# Patient Record
Sex: Male | Born: 1955 | Race: White | Hispanic: No | Marital: Married | Smoking: Never smoker
Health system: Southern US, Community
[De-identification: ages and names within clinical notes are randomized; demographics above are authoritative.]

## PROBLEM LIST (undated history)

## (undated) DIAGNOSIS — E785 Hyperlipidemia, unspecified: Secondary | ICD-10-CM

## (undated) DIAGNOSIS — M1990 Unspecified osteoarthritis, unspecified site: Secondary | ICD-10-CM

## (undated) DIAGNOSIS — Z87442 Personal history of urinary calculi: Secondary | ICD-10-CM

## (undated) HISTORY — PX: CYSTOSCOPY: SUR368

## (undated) HISTORY — PX: EXTRACORPOREAL SHOCK WAVE LITHOTRIPSY: SHX1557

## (undated) HISTORY — PX: OTHER SURGICAL HISTORY: SHX169

---

## 2000-11-20 ENCOUNTER — Encounter: Payer: Self-pay | Admitting: Urology

## 2000-11-20 ENCOUNTER — Emergency Department (HOSPITAL_COMMUNITY): Admission: EM | Admit: 2000-11-20 | Discharge: 2000-11-20 | Payer: Self-pay | Admitting: Emergency Medicine

## 2000-11-21 ENCOUNTER — Observation Stay (HOSPITAL_COMMUNITY): Admission: EM | Admit: 2000-11-21 | Discharge: 2000-11-22 | Payer: Self-pay | Admitting: Emergency Medicine

## 2000-11-22 ENCOUNTER — Encounter: Payer: Self-pay | Admitting: Urology

## 2000-11-23 ENCOUNTER — Encounter: Payer: Self-pay | Admitting: Urology

## 2000-11-23 ENCOUNTER — Ambulatory Visit (HOSPITAL_COMMUNITY): Admission: RE | Admit: 2000-11-23 | Discharge: 2000-11-23 | Payer: Self-pay | Admitting: Urology

## 2002-01-14 ENCOUNTER — Encounter: Admission: RE | Admit: 2002-01-14 | Discharge: 2002-01-14 | Payer: Self-pay | Admitting: Internal Medicine

## 2002-01-14 ENCOUNTER — Encounter: Payer: Self-pay | Admitting: Internal Medicine

## 2004-07-12 ENCOUNTER — Ambulatory Visit (HOSPITAL_COMMUNITY): Admission: RE | Admit: 2004-07-12 | Discharge: 2004-07-12 | Payer: Self-pay | Admitting: *Deleted

## 2005-01-31 ENCOUNTER — Encounter: Admission: RE | Admit: 2005-01-31 | Discharge: 2005-01-31 | Payer: Self-pay | Admitting: Internal Medicine

## 2007-10-15 ENCOUNTER — Ambulatory Visit (HOSPITAL_COMMUNITY): Admission: RE | Admit: 2007-10-15 | Discharge: 2007-10-15 | Payer: Self-pay | Admitting: Specialist

## 2008-11-16 ENCOUNTER — Ambulatory Visit (HOSPITAL_BASED_OUTPATIENT_CLINIC_OR_DEPARTMENT_OTHER): Admission: RE | Admit: 2008-11-16 | Discharge: 2008-11-17 | Payer: Self-pay | Admitting: Orthopedic Surgery

## 2010-03-29 ENCOUNTER — Emergency Department (HOSPITAL_COMMUNITY): Admission: EM | Admit: 2010-03-29 | Discharge: 2010-03-29 | Payer: Self-pay | Admitting: Emergency Medicine

## 2011-01-06 LAB — URINALYSIS, ROUTINE W REFLEX MICROSCOPIC
Bilirubin Urine: NEGATIVE
Nitrite: NEGATIVE
Specific Gravity, Urine: 1.025 (ref 1.005–1.030)
Urobilinogen, UA: 0.2 mg/dL (ref 0.0–1.0)
pH: 5 (ref 5.0–8.0)

## 2011-02-03 LAB — POCT HEMOGLOBIN-HEMACUE: Hemoglobin: 15.7 g/dL (ref 13.0–17.0)

## 2011-03-04 NOTE — Op Note (Signed)
Andrew Barnes               ACCOUNT NO.:  [id number]   MEDICAL RECORD NO.:  [id number]          PATIENT TYPE:  AMB   LOCATION:  DSC                          FACILITY:  [facility]   PHYSICIAN:  Andrew Barnes, M.D. DATE OF BIRTH:  [birth year]   DATE OF PROCEDURE:  11/16/2008  DATE OF DISCHARGE:                               OPERATIVE REPORT   PREOPERATIVE DIAGNOSES:  MRI documented retracted rotator cuff tear,  full-thickness mid substance necrotic and severe acromioclavicular  arthropathy.   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  No sign of a  superior labrum anterior and posterior tear and extensive rotator cuff  tendinopathy at the watershed zone between the posterior supraspinatus  and anterior infraspinatus.   OPERATIONS:  1. Examination of left shoulder under anesthesia.  2. Diagnostic arthroscopy, left shoulder with identification of labral      degenerative tear and mid substance necrotic rotator cuff tear      followed by extensive arthroscopic debridement of labrum, grade 4      chondromalacia, abrasion chondroplasty, and rotator cuff      debridement.  3. Arthroscopic subacromial decompression with limited acromioplasty      and capsulectomy of acromioclavicular joint/bursectomy.  4. Arthroscopic distal clavicle resection.  5. Hybrid repair of rotator cuff with arthroscopic placement of      anchors and sutures followed by aborting arthroscopic repair due to      concerns about inadequate purchase of the posterior swivel lock      anchor with fiber tape.   Given the circumstances, I ultimately completed a muscle-splitting  incision, tested the anchor, removed the  anchor, and replaced it with a  6.5-mm Bio-Corkscrew anchor that had excellent purchase.   Ultimately, we achieved a repair with 4 medial mattress sutures and a  repair of over-the-top swivel lock lateral sutures insetting a very low  profile repair.   The coracoacromial ligament was left intact.   OPERATING SURGEON:  Andrew Fitch. Sypher, MD   ASSISTANT:  Andrew Reeks Dasnoit, PA-C   ANESTHESIA:  General by endotracheal technique supplemented by a left  interscalene block.   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   Clinical examination in August 2009 suggested stage III impingement,  weakness, and probable rotator cuff tear.  He was sent for an MRI in  August 2009 that revealed a significant mid substance rotator cuff tear  at the watershed zone between the posterior supraspinatus and anterior  infraspinatus.  He had very unfavorable AC anatomy.   I advised Andrew Barnes that it was not urgent that he proceed with surgery  immediately; however, over time I suspect that his shoulder function and  comfort would deteriorate leading  him to proceed with a repair of the  rotator cuff.  He now returns in January 2010 with increased pain,  increased weakness, desiring to proceed with reconstruction of his  rotator cuff.   After informed consent, he was brought to the operating room at this  time.   Preoperatively, Andrew Barnes interviewed him in the holding area and  recommended proceeding with an interscalene block.  This was placed in  the holding area without complication.   Questions regarding the anticipated surgery invited and answered in  detail.   PROCEDURE:  Andrew Barnes was brought to the operating room and  placed in supine position upon the operating table.   Following induction of general endotracheal anesthesia  under Andrew Barnes  strict supervision, he was carefully positioned in the beach-chair  position with aid of a torso and head holder designed for shoulder  arthroscopy.  The entire left upper extremity and forequarter prepped  with DuraPrep and draped with impervious arthroscopy drapes.   Examination of the shoulder under anesthesia revealed stability.  There  is crepitation in abduction and external rotation beneath the Andrew Barnes joint.   The scope was then placed in a standard posterior viewing portal.  Diagnostic arthroscopy revealed a severely degenerative labrum from 3  o'clock anteriorly to 11 o'clock posteriorly.  An anterior portal was  created under direct vision followed by use of suction shaver to debride  this to a stable margin.   A nerve hook was used to test the biceps origin.  There was no sign of a  significant SLAP lesion.  The deep surface of the rotator cuff was  inspected and cavitation of the articular surface was noted.  This  corresponded with the large defect noted on the MRI.   A lateral portal was created to the deltoid with a guiding spinal needle  followed by use of a trocar and a 4.5-mm suction shaver.  The  degenerative tendon was debrided exposing the greater tuberosity.  The  entire conjoint tendon had pulled off the tuberosity and was covered on  the articular surface with a thin film of synovium and on the bursal  surface with a bursal leader.  After this was debrided, a rather sizable  tear measuring 4 cm in width was encountered.   A suction bur was used to prepare the greater tuberosity with light  decortication to bleeding bone surface and performing a tuberosity-  plasty to lower the profile of the humerus approximately 2-3 mm.   We then proceeded to subacromial debridement.  Extensive bursectomy was  accomplished to facilitate an arthroscopic repair.  The capsule AC joint  was prominent and ulcerated.  It was hanging within the joint.  This was   removed with the suction shaver and cautery followed by resection of the  distal 15 mm clavicle with arthroscopic technique with a bur brought in  anteriorly.  After hemostasis was achieved, we completed our bursal  debridement, preparing to repair the rotator cuff arthroscopically.   Two Bio-Corkscrew anchors were placed through separate portals, one at  the footprint of the supraspinatus, one at the footprint of the  infraspinatus.  Both were driven home.  There were some concerns about  the purchase of the posterior anchor.  We then proceeded to place  arthroscopic sutures with a Scorpion.  This was accomplished without  difficulty until we tensioned the posterior sutures.  I had the sense  that the posterior suture anchor was not secured, therefore, we  elected  to abort the remainder of the purely arthroscopic procedure and  proceeded to a hybrid open repair with the muscle split to deltoid.  Indeed upon inspection, the posterior anchor did not have adequate  purchase.  The anchor was removed while the fiber tape was left in the  infraspinatus.  The posterior hole was re-trephined with a punch and a  6.5-mm Bio-Corkscrew anchors were placed with excellent purchase.  The  four tails of this anchor were placed with a Scorpion suture passer to  create mattress sutures followed by conversion of the fiber tape to a  simple loop with the suture tails exiting superiorly.  The anterior  anchor was used with a mattress suture with the FiberWire and both limbs  of the fiber tape were placed.  A pair of lateral swivel lock inset  anchors were used to inset the tear.  Excellent apposition of the  supraspinatus and infraspinatus to the anatomic footprint was  accomplished.  A very low profile was achieved.  We elected to leave the  coracoacromial ligament intact.   The scope was replaced with glenohumeral joint and the joint was  thoroughly irrigated with sterile saline.  The footprint of repair  was  anatomic.   The wound was irrigated with sterile saline followed by repair of the  deltoid split with an apical corner suture figure-of-eight style of #2  FiberWire and interrupted figure-of-eight sutures of 0 Vicryl.  The skin  was repaired with subcutaneous suture of 2-0 Vicryl and intradermal 2-0  Prolene with Steri-Strips.  There were no apparent complications.  Mr.  Goracke tolerated the surgery and anesthesia well.  He was transferred to  recovery room with stable signs.      Andrew Fitch Barnes, M.D.  Electronically Signed     RVS/MEDQ  D:  11/16/2008  T:  11/17/2008  Job:  [id number]

## 2011-03-07 NOTE — Op Note (Signed)
NAMECEDERICK, Andrew Barnes               ACCOUNT NO.:  [id number]   MEDICAL RECORD NO.:  [id number]          PATIENT TYPE:  AMB   LOCATION:  ENDO                         FACILITY:  [facility]   PHYSICIAN:  Georgiana Spinner, M.D.    DATE OF BIRTH:  10-Oct-1956   DATE OF PROCEDURE:  07/12/2004  DATE OF DISCHARGE:                                 OPERATIVE REPORT   PROCEDURE:  Colonoscopy.   INDICATIONS:  Colon cancer screening.   ANESTHESIA:  1.  Demerol 60 mg.  2.  Versed 6 mg.   DESCRIPTION OF PROCEDURE:  With the patient mildly sedated in the left  lateral decubitus position, a rectal examination was performed which was  unremarkable.  The prostate appeared normal.  Subsequently, the Olympus  videoscopic colonoscope was inserted in the rectum and passed under direct  vision to the cecum, identified by ileocecal valve and appendiceal orifice,  both of which were photographed.  From this point, the colonoscope was  slowly withdrawn, taking circumferential views of the colonic mucosa,  stopping only in the rectum which appeared normal on direct and retroflexed  view.  The endoscope was straightened and withdrawn.  The patient's vital  signs and pulse oximeter remained stable.  The patient tolerated the  procedure well without apparent complications.   FINDINGS:  Essentially normal examination.   PLAN:  Consider repeat examination in 5-10 years.      GMO/MEDQ  D:  07/12/2004  T:  07/12/2004  Job:  [id number]

## 2015-12-03 DIAGNOSIS — K4090 Unilateral inguinal hernia, without obstruction or gangrene, not specified as recurrent: Secondary | ICD-10-CM | POA: Diagnosis not present

## 2016-04-21 DIAGNOSIS — E038 Other specified hypothyroidism: Secondary | ICD-10-CM | POA: Diagnosis not present

## 2016-04-21 DIAGNOSIS — Z125 Encounter for screening for malignant neoplasm of prostate: Secondary | ICD-10-CM | POA: Diagnosis not present

## 2016-04-28 DIAGNOSIS — Z0000 Encounter for general adult medical examination without abnormal findings: Secondary | ICD-10-CM | POA: Diagnosis not present

## 2016-04-28 DIAGNOSIS — E038 Other specified hypothyroidism: Secondary | ICD-10-CM | POA: Diagnosis not present

## 2016-04-28 DIAGNOSIS — Z1212 Encounter for screening for malignant neoplasm of rectum: Secondary | ICD-10-CM | POA: Diagnosis not present

## 2016-04-28 DIAGNOSIS — K4090 Unilateral inguinal hernia, without obstruction or gangrene, not specified as recurrent: Secondary | ICD-10-CM | POA: Diagnosis not present

## 2016-04-28 DIAGNOSIS — Z125 Encounter for screening for malignant neoplasm of prostate: Secondary | ICD-10-CM | POA: Diagnosis not present

## 2016-04-28 DIAGNOSIS — Z1389 Encounter for screening for other disorder: Secondary | ICD-10-CM | POA: Diagnosis not present

## 2016-04-28 DIAGNOSIS — I4510 Unspecified right bundle-branch block: Secondary | ICD-10-CM | POA: Diagnosis not present

## 2016-04-28 DIAGNOSIS — E784 Other hyperlipidemia: Secondary | ICD-10-CM | POA: Diagnosis not present

## 2016-04-28 DIAGNOSIS — Z6828 Body mass index (BMI) 28.0-28.9, adult: Secondary | ICD-10-CM | POA: Diagnosis not present

## 2016-04-28 DIAGNOSIS — G4709 Other insomnia: Secondary | ICD-10-CM | POA: Diagnosis not present

## 2016-04-28 DIAGNOSIS — F330 Major depressive disorder, recurrent, mild: Secondary | ICD-10-CM | POA: Diagnosis not present

## 2016-06-16 DIAGNOSIS — M25562 Pain in left knee: Secondary | ICD-10-CM | POA: Diagnosis not present

## 2016-06-16 DIAGNOSIS — M25561 Pain in right knee: Secondary | ICD-10-CM | POA: Diagnosis not present

## 2016-06-16 DIAGNOSIS — M1711 Unilateral primary osteoarthritis, right knee: Secondary | ICD-10-CM | POA: Diagnosis not present

## 2016-06-16 DIAGNOSIS — M1712 Unilateral primary osteoarthritis, left knee: Secondary | ICD-10-CM | POA: Diagnosis not present

## 2016-06-16 DIAGNOSIS — M170 Bilateral primary osteoarthritis of knee: Secondary | ICD-10-CM | POA: Diagnosis not present

## 2016-06-17 DIAGNOSIS — L821 Other seborrheic keratosis: Secondary | ICD-10-CM | POA: Diagnosis not present

## 2016-06-17 DIAGNOSIS — L570 Actinic keratosis: Secondary | ICD-10-CM | POA: Diagnosis not present

## 2016-07-01 DIAGNOSIS — H900 Conductive hearing loss, bilateral: Secondary | ICD-10-CM | POA: Diagnosis not present

## 2016-07-01 DIAGNOSIS — H6983 Other specified disorders of Eustachian tube, bilateral: Secondary | ICD-10-CM | POA: Diagnosis not present

## 2016-07-01 DIAGNOSIS — Z882 Allergy status to sulfonamides status: Secondary | ICD-10-CM | POA: Diagnosis not present

## 2016-07-01 DIAGNOSIS — Z881 Allergy status to other antibiotic agents status: Secondary | ICD-10-CM | POA: Diagnosis not present

## 2016-08-30 DIAGNOSIS — Z23 Encounter for immunization: Secondary | ICD-10-CM | POA: Diagnosis not present

## 2016-10-29 DIAGNOSIS — Z1389 Encounter for screening for other disorder: Secondary | ICD-10-CM | POA: Diagnosis not present

## 2016-10-29 DIAGNOSIS — Z6829 Body mass index (BMI) 29.0-29.9, adult: Secondary | ICD-10-CM | POA: Diagnosis not present

## 2016-10-29 DIAGNOSIS — F330 Major depressive disorder, recurrent, mild: Secondary | ICD-10-CM | POA: Diagnosis not present

## 2016-10-29 DIAGNOSIS — J09X2 Influenza due to identified novel influenza A virus with other respiratory manifestations: Secondary | ICD-10-CM | POA: Diagnosis not present

## 2016-10-29 DIAGNOSIS — G4700 Insomnia, unspecified: Secondary | ICD-10-CM | POA: Diagnosis not present

## 2016-10-29 DIAGNOSIS — R21 Rash and other nonspecific skin eruption: Secondary | ICD-10-CM | POA: Diagnosis not present

## 2016-10-29 DIAGNOSIS — R509 Fever, unspecified: Secondary | ICD-10-CM | POA: Diagnosis not present

## 2016-12-12 DIAGNOSIS — Z87442 Personal history of urinary calculi: Secondary | ICD-10-CM | POA: Diagnosis not present

## 2016-12-12 DIAGNOSIS — N5201 Erectile dysfunction due to arterial insufficiency: Secondary | ICD-10-CM | POA: Diagnosis not present

## 2016-12-12 DIAGNOSIS — N486 Induration penis plastica: Secondary | ICD-10-CM | POA: Diagnosis not present

## 2016-12-31 DIAGNOSIS — F330 Major depressive disorder, recurrent, mild: Secondary | ICD-10-CM | POA: Diagnosis not present

## 2016-12-31 DIAGNOSIS — Z6829 Body mass index (BMI) 29.0-29.9, adult: Secondary | ICD-10-CM | POA: Diagnosis not present

## 2017-05-26 DIAGNOSIS — E038 Other specified hypothyroidism: Secondary | ICD-10-CM | POA: Diagnosis not present

## 2017-05-26 DIAGNOSIS — E784 Other hyperlipidemia: Secondary | ICD-10-CM | POA: Diagnosis not present

## 2017-05-26 DIAGNOSIS — Z125 Encounter for screening for malignant neoplasm of prostate: Secondary | ICD-10-CM | POA: Diagnosis not present

## 2017-05-28 DIAGNOSIS — E784 Other hyperlipidemia: Secondary | ICD-10-CM | POA: Diagnosis not present

## 2017-05-28 DIAGNOSIS — M179 Osteoarthritis of knee, unspecified: Secondary | ICD-10-CM | POA: Diagnosis not present

## 2017-05-28 DIAGNOSIS — Z0000 Encounter for general adult medical examination without abnormal findings: Secondary | ICD-10-CM | POA: Diagnosis not present

## 2017-05-28 DIAGNOSIS — G4709 Other insomnia: Secondary | ICD-10-CM | POA: Diagnosis not present

## 2017-06-01 DIAGNOSIS — Z1212 Encounter for screening for malignant neoplasm of rectum: Secondary | ICD-10-CM | POA: Diagnosis not present

## 2017-06-18 DIAGNOSIS — L720 Epidermal cyst: Secondary | ICD-10-CM | POA: Diagnosis not present

## 2017-06-18 DIAGNOSIS — L570 Actinic keratosis: Secondary | ICD-10-CM | POA: Diagnosis not present

## 2017-06-18 DIAGNOSIS — L821 Other seborrheic keratosis: Secondary | ICD-10-CM | POA: Diagnosis not present

## 2017-07-30 DIAGNOSIS — G8929 Other chronic pain: Secondary | ICD-10-CM | POA: Diagnosis not present

## 2017-07-30 DIAGNOSIS — M25561 Pain in right knee: Secondary | ICD-10-CM | POA: Diagnosis not present

## 2017-07-30 DIAGNOSIS — M25562 Pain in left knee: Secondary | ICD-10-CM | POA: Diagnosis not present

## 2017-07-30 DIAGNOSIS — M170 Bilateral primary osteoarthritis of knee: Secondary | ICD-10-CM | POA: Diagnosis not present

## 2017-08-05 DIAGNOSIS — Z23 Encounter for immunization: Secondary | ICD-10-CM | POA: Diagnosis not present

## 2018-01-05 DIAGNOSIS — M25511 Pain in right shoulder: Secondary | ICD-10-CM | POA: Diagnosis not present

## 2018-01-05 DIAGNOSIS — M25512 Pain in left shoulder: Secondary | ICD-10-CM | POA: Diagnosis not present

## 2018-01-14 DIAGNOSIS — M170 Bilateral primary osteoarthritis of knee: Secondary | ICD-10-CM | POA: Diagnosis not present

## 2018-02-03 DIAGNOSIS — M25511 Pain in right shoulder: Secondary | ICD-10-CM | POA: Diagnosis not present

## 2018-02-10 DIAGNOSIS — M25511 Pain in right shoulder: Secondary | ICD-10-CM | POA: Diagnosis not present

## 2018-03-01 DIAGNOSIS — M62121 Other rupture of muscle (nontraumatic), right upper arm: Secondary | ICD-10-CM | POA: Diagnosis not present

## 2018-03-01 DIAGNOSIS — M66821 Spontaneous rupture of other tendons, right upper arm: Secondary | ICD-10-CM | POA: Diagnosis not present

## 2018-03-01 DIAGNOSIS — M75121 Complete rotator cuff tear or rupture of right shoulder, not specified as traumatic: Secondary | ICD-10-CM | POA: Diagnosis not present

## 2018-04-16 DIAGNOSIS — M170 Bilateral primary osteoarthritis of knee: Secondary | ICD-10-CM | POA: Diagnosis not present

## 2018-05-31 DIAGNOSIS — Z125 Encounter for screening for malignant neoplasm of prostate: Secondary | ICD-10-CM | POA: Diagnosis not present

## 2018-05-31 DIAGNOSIS — E038 Other specified hypothyroidism: Secondary | ICD-10-CM | POA: Diagnosis not present

## 2018-05-31 DIAGNOSIS — E7849 Other hyperlipidemia: Secondary | ICD-10-CM | POA: Diagnosis not present

## 2018-05-31 DIAGNOSIS — R82998 Other abnormal findings in urine: Secondary | ICD-10-CM | POA: Diagnosis not present

## 2018-06-07 DIAGNOSIS — M179 Osteoarthritis of knee, unspecified: Secondary | ICD-10-CM | POA: Diagnosis not present

## 2018-06-07 DIAGNOSIS — Z0000 Encounter for general adult medical examination without abnormal findings: Secondary | ICD-10-CM | POA: Diagnosis not present

## 2018-06-07 DIAGNOSIS — Z01818 Encounter for other preprocedural examination: Secondary | ICD-10-CM | POA: Diagnosis not present

## 2018-06-07 DIAGNOSIS — E7849 Other hyperlipidemia: Secondary | ICD-10-CM | POA: Diagnosis not present

## 2018-06-11 DIAGNOSIS — Z1212 Encounter for screening for malignant neoplasm of rectum: Secondary | ICD-10-CM | POA: Diagnosis not present

## 2018-06-22 DIAGNOSIS — L821 Other seborrheic keratosis: Secondary | ICD-10-CM | POA: Diagnosis not present

## 2018-06-22 DIAGNOSIS — L570 Actinic keratosis: Secondary | ICD-10-CM | POA: Diagnosis not present

## 2018-06-22 DIAGNOSIS — L820 Inflamed seborrheic keratosis: Secondary | ICD-10-CM | POA: Diagnosis not present

## 2018-06-22 DIAGNOSIS — D485 Neoplasm of uncertain behavior of skin: Secondary | ICD-10-CM | POA: Diagnosis not present

## 2018-07-02 DIAGNOSIS — M545 Low back pain: Secondary | ICD-10-CM | POA: Diagnosis not present

## 2018-07-27 ENCOUNTER — Encounter: Payer: Self-pay | Admitting: Student

## 2018-07-27 NOTE — H&P (Signed)
TOTAL KNEE ADMISSION H&P  Patient is being admitted for left total knee arthroplasty.  Subjective:  Chief Complaint:left knee pain.  HPI: Andrew Barnes, 62 y.o. male, has a history of pain and functional disability in the left knee due to arthritis and has failed non-surgical conservative treatments for greater than 12 weeks to includecorticosteriod injections, viscosupplementation injections and activity modification.  Onset of symptoms was gradual, starting >10 years ago with gradually worsening course since that time. The patient noted prior procedures on the knee to include  arthroscopy on the left knee(s).  Patient currently rates pain in the left knee(s) at 10 out of 10 with activity. Patient has worsening of pain with activity and weight bearing, crepitus, joint swelling and instability.  Patient has evidence of lateral and patellofemoral bone-on-bone arthritis in the left knee by imaging studies. There is no active infection.  There are no active problems to display for this patient.  History reviewed. No pertinent past medical history.  History reviewed. No pertinent surgical history.  No current facility-administered medications for this encounter.    No current outpatient medications on file.   Allergies not on file  Social History   Tobacco Use  . Smoking status: Not on file  Substance Use Topics  . Alcohol use: Not on file    History reviewed. No pertinent family history.   Review of Systems  Constitutional: Negative for chills and fever.  HENT: Negative for congestion, sore throat and tinnitus.   Eyes: Negative for double vision, photophobia and pain.  Respiratory: Negative for cough, shortness of breath and wheezing.   Cardiovascular: Negative for chest pain, palpitations and orthopnea.  Gastrointestinal: Negative for heartburn, nausea and vomiting.  Genitourinary: Negative for dysuria, frequency and urgency.  Musculoskeletal: Positive for joint pain.    Neurological: Negative for dizziness, weakness and headaches.    Objective:  Physical Exam  Well nourished and well developed. General: Alert and oriented x3, cooperative and pleasant, no acute distress. Head: normocephalic, atraumatic, neck supple. Eyes: EOMI. Respiratory: breath sounds clear in all fields, no wheezing, rales, or rhonchi. Cardiovascular: Regular rate and rhythm, no murmurs, gallops or rubs.  Abdomen: non-tender to palpation and soft, normoactive bowel sounds. Musculoskeletal: Right Knee Exam: No effusion. Range of motion is 0-135 degrees. No crepitus on range of motion of the knee. Mild medial joint line tenderness. No lateral joint line tenderness. Stable knee. Left Knee Exam: No effusion. Old scars medially. Range of motion is 0-125 degrees. Moderate crepitus on range of motion of the knee. Normal patellar tracking.Some medial greater than lateral joint line tenderness. Stable knee. Calves soft and nontender. Motor function intact in LE. Strength 5/5 LE bilaterally. Neuro: Distal pulses 2+. Sensation to light touch intact in LE.  Vital signs in last 24 hours: Blood pressure: 140/86 mmHg Pulse: 60 bpm   Labs:   There is no height or weight on file to calculate BMI.   Imaging Review Plain radiographs demonstrate severe degenerative joint disease of the left knee(s). The overall alignment isneutral. The bone quality appears to be adequate for age and reported activity level.   Preoperative templating of the joint replacement has been completed, documented, and submitted to the Operating Room personnel in order to optimize intra-operative equipment management.   Anticipated LOS equal to or greater than 2 midnights due to - Age 25 and older with one or more of the following:  - Obesity  - Expected need for hospital services (PT, OT, Nursing) required for safe  discharge  - Anticipated need for postoperative skilled nursing care or inpatient rehab  - Active  co-morbidities: None OR   - Unanticipated findings during/Post Surgery: None  - Patient is a high risk of re-admission due to: None     Assessment/Plan:  End stage arthritis, left knee   The patient history, physical examination, clinical judgment of the provider and imaging studies are consistent with end stage degenerative joint disease of the left knee(s) and total knee arthroplasty is deemed medically necessary. The treatment options including medical management, injection therapy arthroscopy and arthroplasty were discussed at length. The risks and benefits of total knee arthroplasty were presented and reviewed. The risks due to aseptic loosening, infection, stiffness, patella tracking problems, thromboembolic complications and other imponderables were discussed. The patient acknowledged the explanation, agreed to proceed with the plan and consent was signed. Patient is being admitted for inpatient treatment for surgery, pain control, PT, OT, prophylactic antibiotics, VTE prophylaxis, progressive ambulation and ADL's and discharge planning. The patient is planning to be discharged home with outpatient physical therapy.   Therapy Plans: outpatient therapy at [facility] Disposition: Home with wife Planned DVT Prophylaxis: Aspirin 325 mg BID DME needed: None PCP: Dr. Link Snuffer TXA: IV Allergies: Erythromycin, sulfa (hives) Anesthesia Concerns: None  - Patient was instructed on what medications to stop prior to surgery. - Follow-up visit in 2 weeks with Dr. Lequita Halt - Begin physical therapy following surgery - Pre-operative lab work as pre-surgical testing - Prescriptions will be provided in hospital at time of discharge  Arther Abbott, PA-C Orthopedic Surgery [facility] [place]

## 2018-08-04 ENCOUNTER — Other Ambulatory Visit (HOSPITAL_COMMUNITY): Payer: Self-pay | Admitting: *Deleted

## 2018-08-04 NOTE — Patient Instructions (Addendum)
Andrew Barnes  08/04/2018   Your procedure is scheduled on: 08-16-18  Report to [facility] Main  Entrance  Report to admitting at 800 AM    Call this number if you have problems the morning of surgery [phone]   Remember: Do not eat food or drink liquids :After Midnight. BRUSH YOUR TEETH MORNING OF SURGERY AND RINSE YOUR MOUTH OUT, NO CHEWING GUM CANDY OR MINTS.     Take these medicines the morning of surgery with A SIP OF WATER: LEVOTHYROXINE                                You may not have any metal on your body including hair pins and              piercings  Do not wear jewelry, make-up, lotions, powders or perfumes, deodorant             Do not wear nail polish.  Do not shave  48 hours prior to surgery.              Men may shave face and neck.   Do not bring valuables to the hospital. [facility] IS NOT             RESPONSIBLE   FOR VALUABLES.  Contacts, dentures or bridgework may not be worn into surgery.  Leave suitcase in the car. After surgery it may be brought to your room.                 Please read over the following fact sheets you were given: _____________________________________________________________________             [facility] - Preparing for Surgery Before surgery, you can play an important role.  Because skin is not sterile, your skin needs to be as free of germs as possible.  You can reduce the number of germs on your skin by washing with CHG (chlorahexidine gluconate) soap before surgery.  CHG is an antiseptic cleaner which kills germs and bonds with the skin to continue killing germs even after washing. Please DO NOT use if you have an allergy to CHG or antibacterial soaps.  If your skin becomes reddened/irritated stop using the CHG and inform your nurse when you arrive at Short Stay. Do not shave (including legs and underarms) for at least 48 hours prior to the first CHG shower.  You may shave your face/neck. Please  follow these instructions carefully:  1.  Shower with CHG Soap the night before surgery and the  morning of Surgery.  2.  If you choose to wash your hair, wash your hair first as usual with your  normal  shampoo.  3.  After you shampoo, rinse your hair and body thoroughly to remove the  shampoo.                           4.  Use CHG as you would any other liquid soap.  You can apply chg directly  to the skin and wash                       Gently with a scrungie or clean washcloth.  5.  Apply the CHG Soap to your body ONLY FROM THE NECK DOWN.  Do not use on face/ open                           Wound or open sores. Avoid contact with eyes, ears mouth and genitals (private parts).                       Wash face,  Genitals (private parts) with your normal soap.             6.  Wash thoroughly, paying special attention to the area where your surgery  will be performed.  7.  Thoroughly rinse your body with warm water from the neck down.  8.  DO NOT shower/wash with your normal soap after using and rinsing off  the CHG Soap.                9.  Pat yourself dry with a clean towel.            10.  Wear clean pajamas.            11.  Place clean sheets on your bed the night of your first shower and do not  sleep with pets. Day of Surgery : Do not apply any lotions/deodorants the morning of surgery.  Please wear clean clothes to the hospital/surgery center.  FAILURE TO FOLLOW THESE INSTRUCTIONS MAY RESULT IN THE CANCELLATION OF YOUR SURGERY PATIENT SIGNATURE_________________________________  NURSE SIGNATURE__________________________________  ________________________________________________________________________   Andrew Barnes  An incentive spirometer is a tool that can help keep your lungs clear and active. This tool measures how well you are filling your lungs with each breath. Taking long deep breaths may help reverse or decrease the chance of developing breathing (pulmonary) problems  (especially infection) following:  A long period of time when you are unable to move or be active. BEFORE THE PROCEDURE   If the spirometer includes an indicator to show your best effort, your nurse or respiratory therapist will set it to a desired goal.  If possible, sit up straight or lean slightly forward. Try not to slouch.  Hold the incentive spirometer in an upright position. INSTRUCTIONS FOR USE  1. Sit on the edge of your bed if possible, or sit up as far as you can in bed or on a chair. 2. Hold the incentive spirometer in an upright position. 3. Breathe out normally. 4. Place the mouthpiece in your mouth and seal your lips tightly around it. 5. Breathe in slowly and as deeply as possible, raising the piston or the ball toward the top of the column. 6. Hold your breath for 3-5 seconds or for as long as possible. Allow the piston or ball to fall to the bottom of the column. 7. Remove the mouthpiece from your mouth and breathe out normally. 8. Rest for a few seconds and repeat Steps 1 through 7 at least 10 times every 1-2 hours when you are awake. Take your time and take a few normal breaths between deep breaths. 9. The spirometer may include an indicator to show your best effort. Use the indicator as a goal to work toward during each repetition. 10. After each set of 10 deep breaths, practice coughing to be sure your lungs are clear. If you have an incision (the cut made at the time of surgery), support your incision when coughing by placing a pillow or rolled up towels firmly against it. Once you are able to get out of  bed, walk around indoors and cough well. You may stop using the incentive spirometer when instructed by your caregiver.  RISKS AND COMPLICATIONS  Take your time so you do not get dizzy or light-headed.  If you are in pain, you may need to take or ask for pain medication before doing incentive spirometry. It is harder to take a deep breath if you are having  pain. AFTER USE  Rest and breathe slowly and easily.  It can be helpful to keep track of a log of your progress. Your caregiver can provide you with a simple table to help with this. If you are using the spirometer at home, follow these instructions: [place] IF:   You are having difficultly using the spirometer.  You have trouble using the spirometer as often as instructed.  Your pain medication is not giving enough relief while using the spirometer.  You develop fever of 100.5 F (38.1 C) or higher. SEEK IMMEDIATE MEDICAL CARE IF:   You cough up bloody sputum that had not been present before.  You develop fever of 102 F (38.9 C) or greater.  You develop worsening pain at or near the incision site. MAKE SURE YOU:   Understand these instructions.  Will watch your condition.  Will get help right away if you are not doing well or get worse. Document Released: 02/16/2007 Document Revised: 12/29/2011 Document Reviewed: 04/19/2007 Exit[employer] Patient Information 2014 Exit[employer], [place].   ________________________________________________________________________  WHAT IS A BLOOD TRANSFUSION? Blood Transfusion Information  A transfusion is the replacement of blood or some of its parts. Blood is made up of multiple cells which provide different functions.  Red blood cells carry oxygen and are used for blood loss replacement.  White blood cells fight against infection.  Platelets control bleeding.  Plasma helps clot blood.  Other blood products are available for specialized needs, such as hemophilia or other clotting disorders. BEFORE THE TRANSFUSION  Who gives blood for transfusions?   Healthy volunteers who are fully evaluated to make sure their blood is safe. This is blood bank blood. Transfusion therapy is the safest it has ever been in the practice of medicine. Before blood is taken from a donor, a complete history is taken to make sure that person has no history  of diseases nor engages in risky social behavior (examples are intravenous drug use or sexual activity with multiple partners). The donor's travel history is screened to minimize risk of transmitting infections, such as malaria. The donated blood is tested for signs of infectious diseases, such as HIV and hepatitis. The blood is then tested to be sure it is compatible with you in order to minimize the chance of a transfusion reaction. If you or a relative donates blood, this is often done in anticipation of surgery and is not appropriate for emergency situations. It takes many days to process the donated blood. RISKS AND COMPLICATIONS Although transfusion therapy is very safe and saves many lives, the main dangers of transfusion include:   Getting an infectious disease.  Developing a transfusion reaction. This is an allergic reaction to something in the blood you were given. Every precaution is taken to prevent this. The decision to have a blood transfusion has been considered carefully by your caregiver before blood is given. Blood is not given unless the benefits outweigh the risks. AFTER THE TRANSFUSION  Right after receiving a blood transfusion, you will usually feel much better and more energetic. This is especially true if your red blood  cells have gotten low (anemic). The transfusion raises the level of the red blood cells which carry oxygen, and this usually causes an energy increase.  The nurse administering the transfusion will monitor you carefully for complications. HOME CARE INSTRUCTIONS  No special instructions are needed after a transfusion. You may find your energy is better. Speak with your caregiver about any limitations on activity for underlying diseases you may have. SEEK MEDICAL CARE IF:   Your condition is not improving after your transfusion.  You develop redness or irritation at the intravenous (IV) site. SEEK IMMEDIATE MEDICAL CARE IF:  Any of the following symptoms  occur over the next 12 hours:  Shaking chills.  You have a temperature by mouth above 102 F (38.9 C), not controlled by medicine.  Chest, back, or muscle pain.  People around you feel you are not acting correctly or are confused.  Shortness of breath or difficulty breathing.  Dizziness and fainting.  You get a rash or develop hives.  You have a decrease in urine output.  Your urine turns a dark color or changes to pink, red, or brown. Any of the following symptoms occur over the next 10 days:  You have a temperature by mouth above 102 F (38.9 C), not controlled by medicine.  Shortness of breath.  Weakness after normal activity.  The white part of the eye turns yellow (jaundice).  You have a decrease in the amount of urine or are urinating less often.  Your urine turns a dark color or changes to pink, red, or brown. Document Released: 10/03/2000 Document Revised: 12/29/2011 Document Reviewed: 05/22/2008 [facility] Patient Information 2014 [place], [place].  _______________________________________________________________________

## 2018-08-07 DIAGNOSIS — Z23 Encounter for immunization: Secondary | ICD-10-CM | POA: Diagnosis not present

## 2018-08-11 ENCOUNTER — Encounter (HOSPITAL_COMMUNITY)
Admission: RE | Admit: 2018-08-11 | Discharge: 2018-08-11 | Disposition: A | Discharge status: Home / Self Care | Payer: Medicare Other | Source: Ambulatory Visit | Attending: Orthopedic Surgery | Admitting: Orthopedic Surgery

## 2018-08-11 ENCOUNTER — Other Ambulatory Visit: Payer: Self-pay

## 2018-08-11 ENCOUNTER — Encounter (HOSPITAL_COMMUNITY): Payer: Self-pay

## 2018-08-11 DIAGNOSIS — M1712 Unilateral primary osteoarthritis, left knee: Secondary | ICD-10-CM | POA: Diagnosis not present

## 2018-08-11 DIAGNOSIS — Z01812 Encounter for preprocedural laboratory examination: Secondary | ICD-10-CM | POA: Insufficient documentation

## 2018-08-11 HISTORY — DX: Hyperlipidemia, unspecified: E78.5

## 2018-08-11 HISTORY — DX: Personal history of urinary calculi: Z87.442

## 2018-08-11 HISTORY — DX: Unspecified osteoarthritis, unspecified site: M19.90

## 2018-08-11 LAB — CBC
HEMATOCRIT: 44 % (ref 39.0–52.0)
Hemoglobin: 14.5 g/dL (ref 13.0–17.0)
MCH: 31.3 pg (ref 26.0–34.0)
MCHC: 33 g/dL (ref 30.0–36.0)
MCV: 94.8 fL (ref 80.0–100.0)
NRBC: 0 % (ref 0.0–0.2)
PLATELETS: 244 10*3/uL (ref 150–400)
RBC: 4.64 MIL/uL (ref 4.22–5.81)
RDW: 12.2 % (ref 11.5–15.5)
WBC: 4.3 10*3/uL (ref 4.0–10.5)

## 2018-08-11 LAB — APTT: aPTT: 31 seconds (ref 24–36)

## 2018-08-11 LAB — PROTIME-INR
INR: 0.88
PROTHROMBIN TIME: 11.8 s (ref 11.4–15.2)

## 2018-08-11 LAB — COMPREHENSIVE METABOLIC PANEL
ALT: 34 U/L (ref 0–44)
AST: 24 U/L (ref 15–41)
Albumin: 4.4 g/dL (ref 3.5–5.0)
Alkaline Phosphatase: 58 U/L (ref 38–126)
Anion gap: 9 (ref 5–15)
BILIRUBIN TOTAL: 1 mg/dL (ref 0.3–1.2)
BUN: 17 mg/dL (ref 8–23)
CHLORIDE: 104 mmol/L (ref 98–111)
CO2: 26 mmol/L (ref 22–32)
Calcium: 10 mg/dL (ref 8.9–10.3)
Creatinine, Ser: 1.04 mg/dL (ref 0.61–1.24)
GFR calc Af Amer: >60 mL/min (ref >60)
Glucose, Bld: 104 mg/dL — ABNORMAL HIGH (ref 70–99)
POTASSIUM: 4.5 mmol/L (ref 3.5–5.1)
Sodium: 139 mmol/L (ref 135–145)
Total Protein: 7.5 g/dL (ref 6.5–8.1)

## 2018-08-11 LAB — SURGICAL PCR SCREEN
MRSA, PCR: NEGATIVE
Staphylococcus aureus: NEGATIVE

## 2018-08-11 LAB — ABO/RH: ABO/RH(D): O POS

## 2018-08-11 NOTE — Progress Notes (Addendum)
Medical clearance and ekg done 819-19 on chart

## 2018-08-15 MED ORDER — BUPIVACAINE LIPOSOME 1.3 % IJ SUSP
20.0000 mL | Freq: Once | INTRAMUSCULAR | Status: DC
  Filled 2018-08-15: qty 20

## 2018-08-16 ENCOUNTER — Encounter (HOSPITAL_COMMUNITY): Payer: Self-pay | Admitting: *Deleted

## 2018-08-16 ENCOUNTER — Inpatient Hospital Stay (HOSPITAL_COMMUNITY): Payer: Medicare Other | Admitting: Anesthesiology

## 2018-08-16 ENCOUNTER — Other Ambulatory Visit: Payer: Self-pay

## 2018-08-16 ENCOUNTER — Inpatient Hospital Stay (HOSPITAL_COMMUNITY)
Admission: RE | Admit: 2018-08-16 | Discharge: 2018-08-17 | DRG: 470 | Disposition: A | Discharge status: Home / Self Care | Payer: Medicare Other | Attending: Orthopedic Surgery | Admitting: Orthopedic Surgery

## 2018-08-16 ENCOUNTER — Encounter (HOSPITAL_COMMUNITY)
Admission: RE | Disposition: A | Discharge status: Home / Self Care | Payer: Self-pay | Source: Home / Self Care | Attending: Orthopedic Surgery

## 2018-08-16 DIAGNOSIS — M1710 Unilateral primary osteoarthritis, unspecified knee: Secondary | ICD-10-CM | POA: Diagnosis present

## 2018-08-16 DIAGNOSIS — Z881 Allergy status to other antibiotic agents status: Secondary | ICD-10-CM

## 2018-08-16 DIAGNOSIS — Z6828 Body mass index (BMI) 28.0-28.9, adult: Secondary | ICD-10-CM

## 2018-08-16 DIAGNOSIS — E785 Hyperlipidemia, unspecified: Secondary | ICD-10-CM | POA: Diagnosis not present

## 2018-08-16 DIAGNOSIS — M25762 Osteophyte, left knee: Secondary | ICD-10-CM | POA: Diagnosis not present

## 2018-08-16 DIAGNOSIS — E669 Obesity, unspecified: Secondary | ICD-10-CM | POA: Diagnosis present

## 2018-08-16 DIAGNOSIS — Z87442 Personal history of urinary calculi: Secondary | ICD-10-CM

## 2018-08-16 DIAGNOSIS — M179 Osteoarthritis of knee, unspecified: Secondary | ICD-10-CM | POA: Diagnosis present

## 2018-08-16 DIAGNOSIS — Z882 Allergy status to sulfonamides status: Secondary | ICD-10-CM | POA: Diagnosis not present

## 2018-08-16 DIAGNOSIS — M1712 Unilateral primary osteoarthritis, left knee: Secondary | ICD-10-CM | POA: Diagnosis not present

## 2018-08-16 DIAGNOSIS — G8918 Other acute postprocedural pain: Secondary | ICD-10-CM | POA: Diagnosis not present

## 2018-08-16 HISTORY — PX: TOTAL KNEE ARTHROPLASTY: SHX125

## 2018-08-16 LAB — TYPE AND SCREEN
ABO/RH(D): O POS
Antibody Screen: NEGATIVE

## 2018-08-16 SURGERY — ARTHROPLASTY, KNEE, TOTAL
Anesthesia: Spinal | Site: Knee | Laterality: Left

## 2018-08-16 MED ORDER — SODIUM CHLORIDE 0.9 % IV SOLN
INTRAVENOUS | Status: DC
  Administered 2018-08-17: 05:00:00 via INTRAVENOUS

## 2018-08-16 MED ORDER — METOCLOPRAMIDE HCL 5 MG PO TABS: 5.0000 mg | ORAL_TABLET | Freq: Three times a day (TID) | ORAL | Status: DC | PRN

## 2018-08-16 MED ORDER — TRAMADOL HCL 50 MG PO TABS
50.0000 mg | ORAL_TABLET | Freq: Four times a day (QID) | ORAL | Status: DC | PRN
  Administered 2018-08-17: 50 mg via ORAL
  Administered 2018-08-17: 100 mg via ORAL
  Filled 2018-08-16: qty 1
  Filled 2018-08-16: qty 2

## 2018-08-16 MED ORDER — BUPIVACAINE LIPOSOME 1.3 % IJ SUSP
INTRAMUSCULAR | Status: DC | PRN
  Administered 2018-08-16: 20 mL

## 2018-08-16 MED ORDER — PROPOFOL 10 MG/ML IV BOLUS
INTRAVENOUS | Status: AC
  Filled 2018-08-16: qty 40

## 2018-08-16 MED ORDER — ASPIRIN EC 325 MG PO TBEC
325.0000 mg | DELAYED_RELEASE_TABLET | Freq: Two times a day (BID) | ORAL | Status: DC
  Administered 2018-08-17: 325 mg via ORAL
  Filled 2018-08-16: qty 1

## 2018-08-16 MED ORDER — HYDROMORPHONE HCL 1 MG/ML IJ SOLN: 0.2500 mg | INTRAMUSCULAR | Status: DC | PRN

## 2018-08-16 MED ORDER — MIDAZOLAM HCL 2 MG/2ML IJ SOLN
2.0000 mg | Freq: Once | INTRAMUSCULAR | Status: AC
  Administered 2018-08-16 (×2): 1 mg via INTRAVENOUS
  Filled 2018-08-16: qty 2

## 2018-08-16 MED ORDER — PROMETHAZINE HCL 25 MG/ML IJ SOLN: 6.2500 mg | INTRAMUSCULAR | Status: DC | PRN

## 2018-08-16 MED ORDER — CEFAZOLIN SODIUM-DEXTROSE 2-4 GM/100ML-% IV SOLN
2.0000 g | INTRAVENOUS | Status: AC
  Administered 2018-08-16: 2 g via INTRAVENOUS
  Filled 2018-08-16: qty 100

## 2018-08-16 MED ORDER — ONDANSETRON HCL 4 MG/2ML IJ SOLN
INTRAMUSCULAR | Status: AC
  Filled 2018-08-16: qty 2

## 2018-08-16 MED ORDER — DEXAMETHASONE SODIUM PHOSPHATE 10 MG/ML IJ SOLN: 8.0000 mg | Freq: Once | INTRAMUSCULAR | Status: DC

## 2018-08-16 MED ORDER — LEVOTHYROXINE SODIUM 50 MCG PO TABS
50.0000 ug | ORAL_TABLET | Freq: Every day | ORAL | Status: DC
  Administered 2018-08-17: 50 ug via ORAL
  Filled 2018-08-16: qty 1

## 2018-08-16 MED ORDER — SODIUM CHLORIDE 0.9 % IR SOLN
Status: DC | PRN
  Administered 2018-08-16: 1000 mL

## 2018-08-16 MED ORDER — SODIUM CHLORIDE 0.9 % IJ SOLN
INTRAMUSCULAR | Status: AC
  Filled 2018-08-16: qty 50

## 2018-08-16 MED ORDER — EPHEDRINE 5 MG/ML INJ
INTRAVENOUS | Status: AC
  Filled 2018-08-16: qty 10

## 2018-08-16 MED ORDER — CLONIDINE HCL (ANALGESIA) 100 MCG/ML EP SOLN
EPIDURAL | Status: DC | PRN
  Administered 2018-08-16: 50 ug

## 2018-08-16 MED ORDER — ONDANSETRON HCL 4 MG/2ML IJ SOLN
INTRAMUSCULAR | Status: DC | PRN
  Administered 2018-08-16: 4 mg via INTRAVENOUS

## 2018-08-16 MED ORDER — CEFAZOLIN SODIUM-DEXTROSE 2-4 GM/100ML-% IV SOLN
2.0000 g | Freq: Four times a day (QID) | INTRAVENOUS | Status: AC
  Administered 2018-08-16 (×2): 2 g via INTRAVENOUS
  Filled 2018-08-16 (×2): qty 100

## 2018-08-16 MED ORDER — FLEET ENEMA 7-19 GM/118ML RE ENEM: 1.0000 | ENEMA | Freq: Once | RECTAL | Status: DC | PRN

## 2018-08-16 MED ORDER — DEXAMETHASONE SODIUM PHOSPHATE 10 MG/ML IJ SOLN
10.0000 mg | Freq: Once | INTRAMUSCULAR | Status: AC
  Administered 2018-08-17: 10 mg via INTRAVENOUS
  Filled 2018-08-16: qty 1

## 2018-08-16 MED ORDER — GABAPENTIN 300 MG PO CAPS
300.0000 mg | ORAL_CAPSULE | Freq: Once | ORAL | Status: AC
  Administered 2018-08-16: 300 mg via ORAL
  Filled 2018-08-16: qty 1

## 2018-08-16 MED ORDER — MENTHOL 3 MG MT LOZG: 1.0000 | LOZENGE | OROMUCOSAL | Status: DC | PRN

## 2018-08-16 MED ORDER — ONDANSETRON HCL 4 MG PO TABS: 4.0000 mg | ORAL_TABLET | Freq: Four times a day (QID) | ORAL | Status: DC | PRN

## 2018-08-16 MED ORDER — OXYCODONE HCL 5 MG PO TABS
5.0000 mg | ORAL_TABLET | ORAL | Status: DC | PRN
  Administered 2018-08-16 (×2): 5 mg via ORAL
  Administered 2018-08-16: 10 mg via ORAL
  Administered 2018-08-17: 5 mg via ORAL
  Administered 2018-08-17: 10 mg via ORAL
  Filled 2018-08-16: qty 1
  Filled 2018-08-16 (×3): qty 2
  Filled 2018-08-16: qty 1

## 2018-08-16 MED ORDER — METOCLOPRAMIDE HCL 5 MG/ML IJ SOLN: 5.0000 mg | Freq: Three times a day (TID) | INTRAMUSCULAR | Status: DC | PRN

## 2018-08-16 MED ORDER — DOCUSATE SODIUM 100 MG PO CAPS
100.0000 mg | ORAL_CAPSULE | Freq: Two times a day (BID) | ORAL | Status: DC
  Administered 2018-08-16 – 2018-08-17 (×2): 100 mg via ORAL
  Filled 2018-08-16 (×2): qty 1

## 2018-08-16 MED ORDER — FENTANYL CITRATE (PF) 100 MCG/2ML IJ SOLN
100.0000 ug | Freq: Once | INTRAMUSCULAR | Status: AC
  Administered 2018-08-16 (×2): 50 ug via INTRAVENOUS
  Filled 2018-08-16: qty 2

## 2018-08-16 MED ORDER — METHOCARBAMOL 500 MG IVPB - SIMPLE MED
500.0000 mg | Freq: Four times a day (QID) | INTRAVENOUS | Status: DC | PRN
  Administered 2018-08-16: 500 mg via INTRAVENOUS
  Filled 2018-08-16: qty 50
  Filled 2018-08-16: qty 500

## 2018-08-16 MED ORDER — BUPIVACAINE HCL (PF) 0.5 % IJ SOLN
INTRAMUSCULAR | Status: DC | PRN
  Administered 2018-08-16: 25 mL

## 2018-08-16 MED ORDER — TRANEXAMIC ACID-NACL 1000-0.7 MG/100ML-% IV SOLN
1000.0000 mg | INTRAVENOUS | Status: AC
  Administered 2018-08-16: 1000 mg via INTRAVENOUS
  Filled 2018-08-16: qty 100

## 2018-08-16 MED ORDER — TRANEXAMIC ACID-NACL 1000-0.7 MG/100ML-% IV SOLN
1000.0000 mg | Freq: Once | INTRAVENOUS | Status: AC
  Administered 2018-08-16: 1000 mg via INTRAVENOUS
  Filled 2018-08-16: qty 100

## 2018-08-16 MED ORDER — CHLORHEXIDINE GLUCONATE 4 % EX LIQD: 60.0000 mL | Freq: Once | CUTANEOUS | Status: DC

## 2018-08-16 MED ORDER — PHENYLEPHRINE 40 MCG/ML (10ML) SYRINGE FOR IV PUSH (FOR BLOOD PRESSURE SUPPORT)
PREFILLED_SYRINGE | INTRAVENOUS | Status: DC | PRN
  Administered 2018-08-16 (×2): 80 ug via INTRAVENOUS

## 2018-08-16 MED ORDER — MEPERIDINE HCL 50 MG/ML IJ SOLN: 6.2500 mg | INTRAMUSCULAR | Status: DC | PRN

## 2018-08-16 MED ORDER — ONDANSETRON HCL 4 MG/2ML IJ SOLN: 4.0000 mg | Freq: Four times a day (QID) | INTRAMUSCULAR | Status: DC | PRN

## 2018-08-16 MED ORDER — OXYCODONE HCL 5 MG PO TABS: 10.0000 mg | ORAL_TABLET | ORAL | Status: DC | PRN

## 2018-08-16 MED ORDER — SODIUM CHLORIDE 0.9 % IJ SOLN
INTRAMUSCULAR | Status: DC | PRN
  Administered 2018-08-16: 60 mL

## 2018-08-16 MED ORDER — DIPHENHYDRAMINE HCL 12.5 MG/5ML PO ELIX
12.5000 mg | ORAL_SOLUTION | ORAL | Status: DC | PRN
  Administered 2018-08-16: 25 mg via ORAL
  Filled 2018-08-16: qty 10

## 2018-08-16 MED ORDER — PHENYLEPHRINE 40 MCG/ML (10ML) SYRINGE FOR IV PUSH (FOR BLOOD PRESSURE SUPPORT)
PREFILLED_SYRINGE | INTRAVENOUS | Status: AC
  Filled 2018-08-16: qty 10

## 2018-08-16 MED ORDER — ACETAMINOPHEN 10 MG/ML IV SOLN
1000.0000 mg | Freq: Four times a day (QID) | INTRAVENOUS | Status: DC
  Administered 2018-08-16: 1000 mg via INTRAVENOUS
  Filled 2018-08-16: qty 100

## 2018-08-16 MED ORDER — ACETAMINOPHEN 500 MG PO TABS
1000.0000 mg | ORAL_TABLET | Freq: Four times a day (QID) | ORAL | Status: AC
  Administered 2018-08-16 – 2018-08-17 (×4): 1000 mg via ORAL
  Filled 2018-08-16 (×4): qty 2

## 2018-08-16 MED ORDER — METHOCARBAMOL 500 MG PO TABS
500.0000 mg | ORAL_TABLET | Freq: Four times a day (QID) | ORAL | Status: DC | PRN
  Administered 2018-08-16 – 2018-08-17 (×3): 500 mg via ORAL
  Filled 2018-08-16 (×3): qty 1

## 2018-08-16 MED ORDER — POLYETHYLENE GLYCOL 3350 17 G PO PACK: 17.0000 g | PACK | Freq: Every day | ORAL | Status: DC | PRN

## 2018-08-16 MED ORDER — SODIUM CHLORIDE 0.9 % IJ SOLN
INTRAMUSCULAR | Status: AC
  Filled 2018-08-16: qty 10

## 2018-08-16 MED ORDER — DEXAMETHASONE SODIUM PHOSPHATE 10 MG/ML IJ SOLN
INTRAMUSCULAR | Status: AC
  Filled 2018-08-16: qty 1

## 2018-08-16 MED ORDER — EPHEDRINE SULFATE-NACL 50-0.9 MG/10ML-% IV SOSY
PREFILLED_SYRINGE | INTRAVENOUS | Status: DC | PRN
  Administered 2018-08-16 (×2): 10 mg via INTRAVENOUS
  Administered 2018-08-16: 15 mg via INTRAVENOUS

## 2018-08-16 MED ORDER — GABAPENTIN 300 MG PO CAPS
300.0000 mg | ORAL_CAPSULE | Freq: Three times a day (TID) | ORAL | Status: DC
  Administered 2018-08-16 – 2018-08-17 (×3): 300 mg via ORAL
  Filled 2018-08-16 (×3): qty 1

## 2018-08-16 MED ORDER — PROPOFOL 500 MG/50ML IV EMUL
INTRAVENOUS | Status: DC | PRN
  Administered 2018-08-16: 100 ug/kg/min via INTRAVENOUS

## 2018-08-16 MED ORDER — BUPIVACAINE IN DEXTROSE 0.75-8.25 % IT SOLN
INTRATHECAL | Status: DC | PRN
  Administered 2018-08-16: 1.8 mL via INTRATHECAL

## 2018-08-16 MED ORDER — PRAVASTATIN SODIUM 20 MG PO TABS
40.0000 mg | ORAL_TABLET | Freq: Every day | ORAL | Status: DC
  Administered 2018-08-16: 40 mg via ORAL
  Filled 2018-08-16: qty 2

## 2018-08-16 MED ORDER — BISACODYL 10 MG RE SUPP: 10.0000 mg | Freq: Every day | RECTAL | Status: DC | PRN

## 2018-08-16 MED ORDER — LACTATED RINGERS IV SOLN
INTRAVENOUS | Status: DC
  Administered 2018-08-16: 09:00:00 via INTRAVENOUS

## 2018-08-16 MED ORDER — PHENOL 1.4 % MT LIQD: 1.0000 | OROMUCOSAL | Status: DC | PRN

## 2018-08-16 MED ORDER — DEXAMETHASONE SODIUM PHOSPHATE 10 MG/ML IJ SOLN
INTRAMUSCULAR | Status: DC | PRN
  Administered 2018-08-16: 10 mg via INTRAVENOUS

## 2018-08-16 SURGICAL SUPPLY — 57 items
ATTUNE MED DOME PAT 38 KNEE (Knees) ×1 IMPLANT
ATTUNE PS FEM LT SZ 6 CEM KNEE (Femur) ×1 IMPLANT
ATTUNE PSRP INSR SZ6 12 KNEE (Insert) ×1 IMPLANT
BAG SPEC THK2 15X12 ZIP CLS (MISCELLANEOUS) ×1
BAG ZIPLOCK 12X15 (MISCELLANEOUS) ×2 IMPLANT
BANDAGE ACE 6X5 VEL STRL LF (GAUZE/BANDAGES/DRESSINGS) ×2 IMPLANT
BASE TIBIA ATTUNE KNEE SYS SZ6 (Knees) IMPLANT
BLADE SAG 18X100X1.27 (BLADE) ×2 IMPLANT
BLADE SAW SGTL 11.0X1.19X90.0M (BLADE) ×2 IMPLANT
BOWL SMART MIX CTS (DISPOSABLE) ×2 IMPLANT
BSPLAT TIB 6 CMNT ROT PLAT STR (Knees) ×1 IMPLANT
CEMENT HV SMART SET (Cement) ×4 IMPLANT
CLSR STERI-STRIP ANTIMIC 1/2X4 (GAUZE/BANDAGES/DRESSINGS) ×1 IMPLANT
COVER SURGICAL LIGHT HANDLE (MISCELLANEOUS) ×2 IMPLANT
COVER WAND RF STERILE (DRAPES) IMPLANT
CUFF TOURN SGL QUICK 34 (TOURNIQUET CUFF) ×2
CUFF TRNQT CYL 34X4X40X1 (TOURNIQUET CUFF) ×1 IMPLANT
DECANTER SPIKE VIAL GLASS SM (MISCELLANEOUS) ×2 IMPLANT
DRAPE U-SHAPE 47X51 STRL (DRAPES) ×2 IMPLANT
DRSG ADAPTIC 3X8 NADH LF (GAUZE/BANDAGES/DRESSINGS) ×2 IMPLANT
DRSG EMULSION OIL 3X16 NADH (GAUZE/BANDAGES/DRESSINGS) ×1 IMPLANT
DRSG PAD ABDOMINAL 8X10 ST (GAUZE/BANDAGES/DRESSINGS) ×2 IMPLANT
DURAPREP 26ML APPLICATOR (WOUND CARE) ×3 IMPLANT
ELECT REM PT RETURN 15FT ADLT (MISCELLANEOUS) ×2 IMPLANT
EVACUATOR 1/8 PVC DRAIN (DRAIN) ×2 IMPLANT
GAUZE SPONGE 4X4 12PLY STRL (GAUZE/BANDAGES/DRESSINGS) ×2 IMPLANT
GLOVE BIO SURGEON STRL SZ8 (GLOVE) ×2 IMPLANT
GLOVE BIOGEL PI IND STRL 6.5 (GLOVE) ×1 IMPLANT
GLOVE BIOGEL PI IND STRL 8 (GLOVE) ×1 IMPLANT
GLOVE BIOGEL PI INDICATOR 6.5 (GLOVE) ×1
GLOVE BIOGEL PI INDICATOR 8 (GLOVE) ×1
GLOVE SURG SS PI 6.5 STRL IVOR (GLOVE) ×2 IMPLANT
GOWN STRL REUS W/TWL LRG LVL3 (GOWN DISPOSABLE) ×5 IMPLANT
GOWN STRL REUS W/TWL XL LVL3 (GOWN DISPOSABLE) ×2 IMPLANT
HANDPIECE INTERPULSE COAX TIP (DISPOSABLE) ×2
HOLDER FOLEY CATH W/STRAP (MISCELLANEOUS) IMPLANT
IMMOBILIZER KNEE 20 (SOFTGOODS) ×3 IMPLANT
IMMOBILIZER KNEE 20 THIGH 36 (SOFTGOODS) ×1 IMPLANT
MANIFOLD NEPTUNE II (INSTRUMENTS) ×2 IMPLANT
NS IRRIG 1000ML POUR BTL (IV SOLUTION) ×2 IMPLANT
PACK TOTAL KNEE CUSTOM (KITS) ×2 IMPLANT
PAD ABD 8X10 STRL (GAUZE/BANDAGES/DRESSINGS) ×1 IMPLANT
PADDING CAST COTTON 6X4 STRL (CAST SUPPLIES) ×4 IMPLANT
PIN STEINMAN FIXATION KNEE (PIN) ×1 IMPLANT
POSITIONER SURGICAL ARM (MISCELLANEOUS) ×2 IMPLANT
SET HNDPC FAN SPRY TIP SCT (DISPOSABLE) ×1 IMPLANT
STRIP CLOSURE SKIN 1/2X4 (GAUZE/BANDAGES/DRESSINGS) ×4 IMPLANT
SUT MNCRL AB 4-0 PS2 18 (SUTURE) ×2 IMPLANT
SUT STRATAFIX 0 PDS 27 VIOLET (SUTURE) ×2
SUT VIC AB 2-0 CT1 27 (SUTURE) ×6
SUT VIC AB 2-0 CT1 TAPERPNT 27 (SUTURE) ×3 IMPLANT
SUTURE STRATFX 0 PDS 27 VIOLET (SUTURE) ×1 IMPLANT
TIBIA ATTUNE KNEE SYS BASE SZ6 (Knees) ×2 IMPLANT
TRAY FOLEY MTR SLVR 16FR STAT (SET/KITS/TRAYS/PACK) ×2 IMPLANT
WATER STERILE IRR 1000ML POUR (IV SOLUTION) ×4 IMPLANT
WRAP KNEE MAXI GEL POST OP (GAUZE/BANDAGES/DRESSINGS) ×2 IMPLANT
YANKAUER SUCT BULB TIP 10FT TU (MISCELLANEOUS) ×2 IMPLANT

## 2018-08-16 NOTE — Op Note (Signed)
OPERATIVE REPORT-TOTAL KNEE ARTHROPLASTY   Pre-operative diagnosis- Osteoarthritis  Left knee(s)  Post-operative diagnosis- Osteoarthritis Left knee(s)  Procedure-  Left  Total Knee Arthroplasty (Depuy Attune)  Surgeon- Gus Rankin. Derrika Ruffalo, MD  Assistant- Dimitri Ped, PA-C   Anesthesia-  Adductor canal block and spinal  EBL-20 mL   Drains Hemovac  Tourniquet time-  Total Tourniquet Time Documented: Thigh (Left) - 45 minutes Total: Thigh (Left) - 45 minutes     Complications- None  Condition-PACU - hemodynamically stable.   Brief Clinical Note   Andrew Barnes is a 62 y.o. year old male with end stage OA of his left knee with progressively worsening pain and dysfunction. He has constant pain, with activity and at rest and significant functional deficits with difficulties even with ADLs. He has had extensive non-op management including analgesics, injections of cortisone and viscosupplements, and home exercise program, but remains in significant pain with significant dysfunction. Radiographs show bone on bone arthritis medial and patellofemoral. He presents now for left Total Knee Arthroplasty.    Procedure in detail---   The patient is brought into the operating room and positioned supine on the operating table. After successful administration of  Adductor canal block and spinal,   a tourniquet is placed high on the  Left thigh(s) and the lower extremity is prepped and draped in the usual sterile fashion. Time out is performed by the operating team and then the  Left lower extremity is wrapped in Esmarch, knee flexed and the tourniquet inflated to 300 mmHg.       A midline incision is made with a ten blade through the subcutaneous tissue to the level of the extensor mechanism. A fresh blade is used to make a medial parapatellar arthrotomy. Soft tissue over the proximal medial tibia is subperiosteally elevated to the joint line with a knife and into the semimembranosus bursa  with a Cobb elevator. Soft tissue over the proximal lateral tibia is elevated with attention being paid to avoiding the patellar tendon on the tibial tubercle. The patella is everted, knee flexed 90 degrees and the ACL and PCL are removed. Findings are bone on bone medial and patellofemoral        The drill is used to create a starting hole in the distal femur and the canal is thoroughly irrigated with sterile saline to remove the fatty contents. The 5 degree Left  valgus alignment guide is placed into the femoral canal and the distal femoral cutting block is pinned to remove 9 mm off the distal femur. Resection is made with an oscillating saw.      The tibia is subluxed forward and the menisci are removed. The extramedullary alignment guide is placed referencing proximally at the medial aspect of the tibial tubercle and distally along the second metatarsal axis and tibial crest. The block is pinned to remove 2mm off the more deficient medial  side. Resection is made with an oscillating saw. Size 6is the most appropriate size for the tibia and the proximal tibia is prepared with the modular drill and keel punch for that size. I had to remove a screw from the proximal tibia prior to doing the keel punch      The femoral sizing guide is placed and size 6 is most appropriate. Rotation is marked off the epicondylar axis and confirmed by creating a rectangular flexion gap at 90 degrees. The size 6 cutting block is pinned in this rotation and the anterior, posterior and chamfer cuts are made with  the oscillating saw. The intercondylar block is then placed and that cut is made.      Trial size 6 tibial component, trial size 6 posterior stabilized femur and a 12  mm posterior stabilized rotating platform insert trial is placed. Full extension is achieved with excellent varus/valgus and anterior/posterior balance throughout full range of motion. The patella is everted and thickness measured to be 24  mm. Free hand  resection is taken to 14 mm, a 38 template is placed, lug holes are drilled, trial patella is placed, and it tracks normally. Osteophytes are removed off the posterior femur with the trial in place. All trials are removed and the cut bone surfaces prepared with pulsatile lavage. Cement is mixed and once ready for implantation, the size 6 tibial implant, size  6 posterior stabilized femoral component, and the size 38 patella are cemented in place and the patella is held with the clamp. The trial insert is placed and the knee held in full extension. The Exparel (20 ml mixed with 60 ml saline) is injected into the extensor mechanism, posterior capsule, medial and lateral gutters and subcutaneous tissues.  All extruded cement is removed and once the cement is hard the permanent 12 mm posterior stabilized rotating platform insert is placed into the tibial tray.      The wound is copiously irrigated with saline solution and the extensor mechanism closed over a hemovac drain with #1 V-loc suture. The tourniquet is released for a total tourniquet time of 45  minutes. Flexion against gravity is 140 degrees and the patella tracks normally. Subcutaneous tissue is closed with 2.0 vicryl and subcuticular with running 4.0 Monocryl. The incision is cleaned and dried and steri-strips and a bulky sterile dressing are applied. The limb is placed into a knee immobilizer and the patient is awakened and transported to recovery in stable condition.      Please note that a surgical assistant was a medical necessity for this procedure in order to perform it in a safe and expeditious manner. Surgical assistant was necessary to retract the ligaments and vital neurovascular structures to prevent injury to them and also necessary for proper positioning of the limb to allow for anatomic placement of the prosthesis.   Gus Rankin Latarra Eagleton, MD    08/16/2018, 11:55 AM

## 2018-08-16 NOTE — Anesthesia Preprocedure Evaluation (Signed)
Anesthesia Evaluation  Patient identified by MRN, date of birth, ID band Patient awake    Reviewed: Allergy & Precautions, NPO status , Patient's Chart, lab work & pertinent test results  Airway Mallampati: II  TM Distance: >3 FB Neck ROM: Full    Dental no notable dental hx.    Pulmonary neg pulmonary ROS,    Pulmonary exam normal breath sounds clear to auscultation       Cardiovascular negative cardio ROS Normal cardiovascular exam Rhythm:Regular Rate:Normal     Neuro/Psych negative neurological ROS  negative psych ROS   GI/Hepatic negative GI ROS, Neg liver ROS,   Endo/Other  negative endocrine ROS  Renal/GU negative Renal ROS     Musculoskeletal  (+) Arthritis ,   Abdominal   Peds  Hematology negative hematology ROS (+)   Anesthesia Other Findings   Reproductive/Obstetrics                             Anesthesia Physical Anesthesia Plan  ASA: II  Anesthesia Plan: Spinal   Post-op Pain Management:  [facility] for Post-op pain   Induction: Intravenous  PONV Risk Score and Plan: 2 and Ondansetron, Dexamethasone, Propofol infusion and Midazolam  Airway Management Planned:   Additional Equipment: None  Intra-op Plan:   Post-operative Plan:   Informed Consent: I have reviewed the patients History and Physical, chart, labs and discussed the procedure including the risks, benefits and alternatives for the proposed anesthesia with the patient or authorized representative who has indicated his/her understanding and acceptance.   Dental advisory given  Plan Discussed with: CRNA  Anesthesia Plan Comments:         Anesthesia Quick Evaluation

## 2018-08-16 NOTE — Interval H&P Note (Signed)
History and Physical Interval Note:  08/16/2018 8:32 AM  Andrew Barnes  has presented today for surgery, with the diagnosis of Left knee osteoarthritis  The various methods of treatment have been discussed with the patient and family. After consideration of risks, benefits and other options for treatment, the patient has consented to  Procedure(s): LEFT TOTAL KNEE ARTHROPLASTY (Left) as a surgical intervention .  The patient's history has been reviewed, patient examined, no change in status, stable for surgery.  I have reviewed the patient's chart and labs.  Questions were answered to the patient's satisfaction.     [name]

## 2018-08-16 NOTE — Progress Notes (Signed)
AssistedDr. Germeroth with left, ultrasound guided, adductor canal block. Side rails up, monitors on throughout procedure. See vital signs in flow sheet. Tolerated Procedure well.  

## 2018-08-16 NOTE — Evaluation (Signed)
Physical Therapy Evaluation Patient Details Name: Andrew Barnes MRN: [id number] DOB: 03-17-1956 Today's Date: 08/16/2018   History of Present Illness  62 Y.o. male s/p L TKR on 08/06/18. PMH includes HLD, OA, L RTC surgery, multiple L knee surgeries.   Clinical Impression  Pt presents with moderate L knee pain, difficulty performing mobility tasks, decreased L knee ROM, and decreased endurance for ambulation. Pt to benefit from acute PT to address deficits. Pt ambulated 50 ft with RW with min guard assist, requiring mod verbal cuing for sequencing. PT to progress mobility as tolerated, and will continue to follow acutely.      Follow Up Recommendations Follow surgeon's recommendation for DC plan and follow-up therapies;Supervision for mobility/OOB(OPPT)    Equipment Recommendations  None recommended by PT    Recommendations for Other Services       Precautions / Restrictions Precautions Precautions: Fall Required Braces or Orthoses: Knee Immobilizer - Left Knee Immobilizer - Left: On when out of bed or walking;Discontinue once straight leg raise with < 10 degree lag Restrictions Weight Bearing Restrictions: No Other Position/Activity Restrictions: WBAT       Mobility  Bed Mobility Overal bed mobility: Needs Assistance Bed Mobility: Supine to Sit     Supine to sit: Min assist;HOB elevated     General bed mobility comments: Min assist for LLE management. Verbal cuing for getting to EOB.   Transfers Overall transfer level: Needs assistance Equipment used: Rolling walker (2 wheeled) Transfers: Sit to/from Stand Sit to Stand: Min guard;From elevated surface         General transfer comment: Min guard for safety. Verbal cuing for hand placement.   Ambulation/Gait Ambulation/Gait assistance: Min guard Gait Distance (Feet): 50 Feet Assistive device: Rolling walker (2 wheeled) Gait Pattern/deviations: Step-to pattern;Decreased stride length;Decreased weight shift to  left;Decreased stance time - left Gait velocity: decr    General Gait Details: Min guard for safety. Verbal cuing for placement in RW  Stairs            Wheelchair Mobility    Modified Rankin (Stroke Patients Only)       Balance Overall balance assessment: Mild deficits observed, not formally tested                                           Pertinent Vitals/Pain Pain Assessment: 0-10 Pain Score: 4  Pain Location: L knee Pain Descriptors / Indicators: Aching;Sore Pain Intervention(s): Limited activity within patient's tolerance;Repositioned;Ice applied;Monitored during session    Home Living Family/patient expects to be discharged to:: Private residence Living Arrangements: Spouse/significant other Available Help at Discharge: Family;Available PRN/intermittently Type of Home: House Home Access: Stairs to enter Entrance Stairs-Rails: Right Entrance Stairs-Number of Steps: 3 Home Layout: One level Home Equipment: Shower seat;Other (comment);Walker - 2 wheels;Cane - single point;Crutches;Bedside commode Additional Comments: uses milk carton to prop RLE on when bathing or using the bathroom.     Prior Function Level of Independence: Independent               Hand Dominance   Dominant Hand: Right    Extremity/Trunk Assessment   Upper Extremity Assessment Upper Extremity Assessment: Overall WFL for tasks assessed    Lower Extremity Assessment Lower Extremity Assessment: Overall WFL for tasks assessed;RLE deficits/detail;LLE deficits/detail RLE Deficits / Details: approximately 10 degrees knee recurvatum with quad set  LLE Deficits / Details: suspected post-surgical weakness;  able to perform quad set, ankle pumps, SLR with min assist with <10* quad lag  LLE Sensation: decreased light touch(decreased sensation around gluteal area; pt with no difficulty performing hip extension in standing or ambulating)    Cervical / Trunk  Assessment Cervical / Trunk Assessment: Normal  Communication   Communication: No difficulties  Cognition Arousal/Alertness: Awake/alert Behavior During Therapy: WFL for tasks assessed/performed Overall Cognitive Status: Within Functional Limits for tasks assessed                                        General Comments      Exercises Total Joint Exercises Ankle Circles/Pumps: AROM;Both;10 reps;Seated Goniometric ROM: Knee AAROM approximately 0-60*, limited by pain and stiffness. One cavitation noted in initial knee flexion, painless and pt is unsure of where the "pop" came from    Assessment/Plan    PT Assessment Patient needs continued PT services  PT Problem List Decreased strength;Pain;Decreased range of motion;Decreased activity tolerance;Decreased knowledge of use of DME;Decreased balance;Decreased safety awareness;Decreased mobility       PT Treatment Interventions Therapeutic activities;DME instruction;Gait training;Therapeutic exercise;Patient/family education;Stair training;Balance training;Functional mobility training    PT Goals (Current goals can be found in the Care Plan section)  Acute Rehab PT Goals Patient Stated Goal: decrease pain  PT Goal Formulation: With patient Time For Goal Achievement: 08/23/18 Potential to Achieve Goals: Good    Frequency 7X/week   Barriers to discharge        Co-evaluation               AM-PAC PT "6 Clicks" Daily Activity  Outcome Measure Difficulty turning over in bed (including adjusting bedclothes, sheets and blankets)?: Unable Difficulty moving from lying on back to sitting on the side of the bed? : Unable Difficulty sitting down on and standing up from a chair with arms (e.g., wheelchair, bedside commode, etc,.)?: Unable Help needed moving to and from a bed to chair (including a wheelchair)?: None Help needed walking in hospital room?: A Little Help needed climbing 3-5 steps with a railing? : A  Little 6 Click Score: 13    End of Session Equipment Utilized During Treatment: Gait belt Activity Tolerance: Patient tolerated treatment well Patient left: in chair;with chair alarm set;with call bell/phone within reach;with family/visitor present;with SCD's reapplied Nurse Communication: Mobility status PT Visit Diagnosis: Other abnormalities of gait and mobility (R26.89);Difficulty in walking, not elsewhere classified (R26.2)    Time: 2956-2130 PT Time Calculation (min) (ACUTE ONLY): 30 min   Charges:   PT Evaluation $PT Eval Low Complexity: 1 Low PT Treatments $Gait Training: 8-22 mins        Nicola Police, PT Acute Rehabilitation Services Pager [phone]  Office [phone]   Aanvi Voyles D Lachlan Mckim 08/16/2018, 7:04 PM

## 2018-08-16 NOTE — Transfer of Care (Signed)
Immediate Anesthesia Transfer of Care Note  Patient: Andrew Barnes  Procedure(s) Performed: LEFT TOTAL KNEE ARTHROPLASTY (Left Knee)  Patient Location: PACU  Anesthesia Type:Spinal and MAC combined with regional for post-op pain  Level of Consciousness: awake, alert , oriented and patient cooperative  Airway & Oxygen Therapy: Patient Spontanous Breathing and Patient connected to face mask oxygen  Post-op Assessment: Report given to RN and Post -op Vital signs reviewed and stable  Post vital signs: Reviewed and stable  Last Vitals:  Vitals Value Taken Time  BP    Temp    Pulse 75 08/16/2018 12:14 PM  Resp 13 08/16/2018 12:14 PM  SpO2 100 % 08/16/2018 12:14 PM  Vitals shown include unvalidated device data.  Last Pain:  Vitals:   08/16/18 0956  TempSrc:   PainSc: 0-No pain      Patients Stated Pain Goal: 3 (34/28/76 8115)  Complications: No apparent anesthesia complications

## 2018-08-16 NOTE — Discharge Instructions (Signed)
° °Dr. Frank Barnes °Total Joint Specialist °[facility] °[address] °[place], [place] [address] °[phone] ° °TOTAL KNEE REPLACEMENT POSTOPERATIVE DIRECTIONS ° °Knee Rehabilitation, Guidelines Following Surgery  °Results after knee surgery are often greatly improved when you follow the exercise, range of motion and muscle strengthening exercises prescribed by your doctor. Safety measures are also important to protect the knee from further injury. Any time any of these exercises cause you to have increased pain or swelling in your knee joint, decrease the amount until you are comfortable again and slowly increase them. If you have problems or questions, call your caregiver or physical therapist for advice.  ° °HOME CARE INSTRUCTIONS  °• Remove items at home which could result in a fall. This includes throw rugs or furniture in walking pathways.  °· ICE to the affected knee every three hours for 30 minutes at a time and then as needed for pain and swelling.  Continue to use ice on the knee for pain and swelling from surgery. You may notice swelling that will progress down to the foot and ankle.  This is normal after surgery.  Elevate the leg when you are not up walking on it.   °· Continue to use the breathing machine which will help keep your temperature down.  It is common for your temperature to cycle up and down following surgery, especially at night when you are not up moving around and exerting yourself.  The breathing machine keeps your lungs expanded and your temperature down. °· Do not place pillow under knee, focus on keeping the knee straight while resting ° °DIET °You may resume your previous home diet once your are discharged from the hospital. ° °DRESSING / WOUND CARE / SHOWERING °You may shower 3 days after surgery, but keep the wounds dry during showering.  You may use an occlusive plastic wrap (Press'n Seal for example), NO SOAKING/SUBMERGING IN THE BATHTUB.  If the bandage  gets wet, change with a clean dry gauze.  If the incision gets wet, pat the wound dry with a clean towel. °You may start showering once you are discharged home but do not submerge the incision under water. Just pat the incision dry and apply a dry gauze dressing on daily. °Change the surgical dressing daily and reapply a dry dressing each time. ° °ACTIVITY °Walk with your walker as instructed. °Use walker as long as suggested by your caregivers. °Avoid periods of inactivity such as sitting longer than an hour when not asleep. This helps prevent blood clots.  °You may resume a sexual relationship in one month or when given the OK by your doctor.  °You may return to work once you are cleared by your doctor.  °Do not drive a car for 6 weeks or until released by you surgeon.  °Do not drive while taking narcotics. ° °WEIGHT BEARING °Weight bearing as tolerated with assist device (walker, cane, etc) as directed, use it as long as suggested by your surgeon or therapist, typically at least 4-6 weeks. ° °POSTOPERATIVE CONSTIPATION PROTOCOL °Constipation - defined medically as fewer than three stools per week and severe constipation as less than one stool per week. ° °One of the most common issues patients have following surgery is constipation.  Even if you have a regular bowel pattern at home, your normal regimen is likely to be disrupted due to multiple reasons following surgery.  Combination of anesthesia, postoperative narcotics, change in appetite and fluid intake all can affect your bowels.    In order to avoid complications following surgery, here are some recommendations in order to help you during your recovery period. ° °Colace (docusate) - Pick up an over-the-counter form of Colace or another stool softener and take twice a day as long as you are requiring postoperative pain medications.  Take with a full glass of water daily.  If you experience loose stools or diarrhea, hold the colace until you stool forms back  up.  If your symptoms do not get better within 1 week or if they get worse, check with your doctor. ° °Dulcolax (bisacodyl) - Pick up over-the-counter and take as directed by the product packaging as needed to assist with the movement of your bowels.  Take with a full glass of water.  Use this product as needed if not relieved by Colace only.  ° °MiraLax (polyethylene glycol) - Pick up over-the-counter to have on hand.  MiraLax is a solution that will increase the amount of water in your bowels to assist with bowel movements.  Take as directed and can mix with a glass of water, juice, soda, coffee, or tea.  Take if you go more than two days without a movement. °Do not use MiraLax more than once per day. Call your doctor if you are still constipated or irregular after using this medication for 7 days in a row. ° °If you continue to have problems with postoperative constipation, please contact the office for further assistance and recommendations.  If you experience "the worst abdominal pain ever" or develop nausea or vomiting, please contact the office immediatly for further recommendations for treatment. ° °ITCHING ° If you experience itching with your medications, try taking only a single pain pill, or even half a pain pill at a time.  You can also use Benadryl over the counter for itching or also to help with sleep.  ° °TED HOSE STOCKINGS °Wear the elastic stockings on both legs for three weeks following surgery during the day but you may remove then at night for sleeping. ° °MEDICATIONS °See your medication summary on the “After Visit Summary” that the nursing staff will review with you prior to discharge.  You may have some home medications which will be placed on hold until you complete the course of blood thinner medication.  It is important for you to complete the blood thinner medication as prescribed by your surgeon.  Continue your approved medications as instructed at time of discharge. ° °PRECAUTIONS °If  you experience chest pain or shortness of breath - call 911 immediately for transfer to the hospital emergency department.  °If you develop a fever greater that 101 F, purulent drainage from wound, increased redness or drainage from wound, foul odor from the wound/dressing, or calf pain - CONTACT YOUR SURGEON.   °                                                °FOLLOW-UP APPOINTMENTS °Make sure you keep all of your appointments after your operation with your surgeon and caregivers. You should call the office at the above phone number and make an appointment for approximately two weeks after the date of your surgery or on the date instructed by your surgeon outlined in the "After Visit Summary". ° ° °RANGE OF MOTION AND STRENGTHENING EXERCISES  °Rehabilitation of the knee is important following a knee injury or   an operation. After just a few days of immobilization, the muscles of the thigh which control the knee become weakened and shrink (atrophy). Knee exercises are designed to build up the tone and strength of the thigh muscles and to improve knee motion. Often times heat used for twenty to thirty minutes before working out will loosen up your tissues and help with improving the range of motion but do not use heat for the first two weeks following surgery. These exercises can be done on a training (exercise) mat, on the floor, on a table or on a bed. Use what ever works the best and is most comfortable for you Knee exercises include:  °• Leg Lifts - While your knee is still immobilized in a splint or cast, you can do straight leg raises. Lift the leg to 60 degrees, hold for 3 sec, and slowly lower the leg. Repeat 10-20 times 2-3 times daily. Perform this exercise against resistance later as your knee gets better.  °• Quad and Hamstring Sets - Tighten up the muscle on the front of the thigh (Quad) and hold for 5-10 sec. Repeat this 10-20 times hourly. Hamstring sets are done by pushing the foot backward against an  object and holding for 5-10 sec. Repeat as with quad sets.  °· Leg Slides: Lying on your back, slowly slide your foot toward your buttocks, bending your knee up off the floor (only go as far as is comfortable). Then slowly slide your foot back down until your leg is flat on the floor again. °· Angel Wings: Lying on your back spread your legs to the side as far apart as you can without causing discomfort.  °A rehabilitation program following serious knee injuries can speed recovery and prevent re-injury in the future due to weakened muscles. Contact your doctor or a physical therapist for more information on knee rehabilitation.  ° °IF YOU ARE TRANSFERRED TO A SKILLED REHAB FACILITY °If the patient is transferred to a skilled rehab facility following release from the hospital, a list of the current medications will be sent to the facility for the patient to continue.  When discharged from the skilled rehab facility, please have the facility set up the patient's Home Health Physical Therapy prior to being released. Also, the skilled facility will be responsible for providing the patient with their medications at time of release from the facility to include their pain medication, the muscle relaxants, and their blood thinner medication. If the patient is still at the rehab facility at time of the two week follow up appointment, the skilled rehab facility will also need to assist the patient in arranging follow up appointment in our office and any transportation needs. ° °MAKE SURE YOU:  °• Understand these instructions.  °• Get help right away if you are not doing well or get worse.  ° ° °Pick up stool softner and laxative for home use following surgery while on pain medications. °Do not submerge incision under water. °Please use good hand washing techniques while changing dressing each day. °May shower starting three days after surgery. °Please use a clean towel to pat the incision dry following showers. °Continue to  use ice for pain and swelling after surgery. °Do not use any lotions or creams on the incision until instructed by your surgeon. ° °

## 2018-08-16 NOTE — Anesthesia Procedure Notes (Signed)
Anesthesia Regional Block: Adductor canal block   Pre-Anesthetic Checklist: ,, timeout performed, Correct Patient, Correct Site, Correct Laterality, Correct Procedure, Correct Position, site marked, Risks and benefits discussed,  Surgical consent,  Pre-op evaluation,  At surgeon's request and post-op pain management  Laterality: Left  Prep: chloraprep       Needles:  Injection technique: Single-shot  Needle Type: Stimiplex     Needle Length: 9cm  Needle Gauge: 21     Additional Needles:   Procedures:,,,, ultrasound used (permanent image in chart),,,,  Narrative:  Start time: 08/16/2018 9:48 AM End time: 08/16/2018 9:50 AM Injection made incrementally with aspirations every 5 mL.  Performed by: Personally  Anesthesiologist: [name], MD  Additional Notes: BP cuff, EKG monitors applied. Sedation begun. Artery and nerve location verified with U/S and anesthetic injected incrementally, slowly, and after negative aspirations under direct u/s guidance. Good fascial /perineural spread. Tolerated well.

## 2018-08-17 ENCOUNTER — Encounter (HOSPITAL_COMMUNITY): Payer: Self-pay | Admitting: Orthopedic Surgery

## 2018-08-17 LAB — CBC
HCT: 38.1 % — ABNORMAL LOW (ref 39.0–52.0)
HEMOGLOBIN: 12.8 g/dL — AB (ref 13.0–17.0)
MCH: 32.1 pg (ref 26.0–34.0)
MCHC: 33.6 g/dL (ref 30.0–36.0)
MCV: 95.5 fL (ref 80.0–100.0)
Platelets: 252 10*3/uL (ref 150–400)
RBC: 3.99 MIL/uL — ABNORMAL LOW (ref 4.22–5.81)
RDW: 12.3 % (ref 11.5–15.5)
WBC: 15.7 10*3/uL — AB (ref 4.0–10.5)
nRBC: 0 % (ref 0.0–0.2)

## 2018-08-17 LAB — BASIC METABOLIC PANEL
Anion gap: 10 (ref 5–15)
BUN: 15 mg/dL (ref 8–23)
CALCIUM: 9.5 mg/dL (ref 8.9–10.3)
CHLORIDE: 105 mmol/L (ref 98–111)
CO2: 26 mmol/L (ref 22–32)
Creatinine, Ser: 1.13 mg/dL (ref 0.61–1.24)
GFR calc non Af Amer: >60 mL/min (ref >60)
Glucose, Bld: 160 mg/dL — ABNORMAL HIGH (ref 70–99)
Potassium: 4.7 mmol/L (ref 3.5–5.1)
SODIUM: 141 mmol/L (ref 135–145)

## 2018-08-17 MED ORDER — OXYCODONE HCL 5 MG PO TABS: 5.0000 mg | ORAL_TABLET | Freq: Four times a day (QID) | ORAL | 0 refills | Status: DC | PRN

## 2018-08-17 MED ORDER — ASPIRIN 325 MG PO TBEC: 325.0000 mg | DELAYED_RELEASE_TABLET | Freq: Two times a day (BID) | ORAL | 0 refills | Status: DC

## 2018-08-17 MED ORDER — METHOCARBAMOL 500 MG PO TABS: 500.0000 mg | ORAL_TABLET | Freq: Four times a day (QID) | ORAL | 0 refills | Status: DC | PRN

## 2018-08-17 MED ORDER — TRAMADOL HCL 50 MG PO TABS: 50.0000 mg | ORAL_TABLET | Freq: Four times a day (QID) | ORAL | 0 refills | Status: DC | PRN

## 2018-08-17 MED ORDER — GABAPENTIN 300 MG PO CAPS: 300.0000 mg | ORAL_CAPSULE | Freq: Three times a day (TID) | ORAL | 0 refills | Status: DC

## 2018-08-17 NOTE — Anesthesia Postprocedure Evaluation (Signed)
Anesthesia Post Note  Patient: Andrew Barnes  Procedure(s) Performed: LEFT TOTAL KNEE ARTHROPLASTY (Left Knee)     Patient location during evaluation: PACU Anesthesia Type: Spinal Level of consciousness: awake and alert Pain management: pain level controlled Vital Signs Assessment: post-procedure vital signs reviewed and stable Respiratory status: spontaneous breathing and respiratory function stable Cardiovascular status: blood pressure returned to baseline and stable Postop Assessment: spinal receding Anesthetic complications: no    Last Vitals:  Vitals:   08/17/18 0502 08/17/18 0800  BP: 113/68 129/70  Pulse: 86 75  Resp:    Temp: 36.6 C 36.6 C  SpO2: 96% 97%    Last Pain:  Vitals:   08/17/18 1347  TempSrc:   PainSc: 6                  [name]

## 2018-08-17 NOTE — Progress Notes (Signed)
   Subjective: 1 Day Post-Op Procedure(s) (LRB): LEFT TOTAL KNEE ARTHROPLASTY (Left) Patient reports pain as mild.   Patient seen in rounds by Dr. Lequita Halt. Patient is well, and has had no acute complaints or problems other than discomfort in the left knee. Foley catheter removed this AM. Denies chest pain, SOB, or calf pain. Endorses appetite.  We will continue therapy today.   Objective: Vital signs in last 24 hours: Temp:  [96.2 F (35.7 C)-98.4 F (36.9 C)] 97.9 F (36.6 C) (10/29 0502) Pulse Rate:  [58-86] 86 (10/29 0502) Resp:  [11-22] 12 (10/28 1645) BP: (96-140)/(51-84) 113/68 (10/29 0502) SpO2:  [95 %-100 %] 96 % (10/29 0502) Weight:  [79 kg] 79 kg (10/28 0815)  Intake/Output from previous day:  Intake/Output Summary (Last 24 hours) at 08/17/2018 0725 Last data filed at 08/17/2018 0700 Gross per 24 hour  Intake 3379 ml  Output 3295 ml  Net 84 ml    Labs: Recent Labs    08/17/18 0529  HGB 12.8*   Recent Labs    08/17/18 0529  WBC 15.7*  RBC 3.99*  HCT 38.1*  PLT 252   Recent Labs    08/17/18 0529  NA 141  K 4.7  CL 105  CO2 26  BUN 15  CREATININE 1.13  GLUCOSE 160*  CALCIUM 9.5   Exam: General - Patient is Alert and Oriented Extremity - Neurologically intact Neurovascular intact Sensation intact distally Dorsiflexion/Plantar flexion intact Dressing - dressing C/D/I Motor Function - intact, moving foot and toes well on exam.   Past Medical History:  Diagnosis Date  . Arthritis    oa  . History of kidney stones   . Hyperlipidemia     Assessment/Plan: 1 Day Post-Op Procedure(s) (LRB): LEFT TOTAL KNEE ARTHROPLASTY (Left) Principal Problem:   OA (osteoarthritis) of knee  Estimated body mass index is 28.11 kg/m as calculated from the following:   Height as of this encounter: 5\' 6"  (1.676 m).   Weight as of this encounter: 79 kg. Advance diet Up with therapy D/C IV fluids  Anticipated LOS equal to or greater than 2 midnights due  to - Age 64 and older with one or more of the following:  - Obesity  - Expected need for hospital services (PT, OT, Nursing) required for safe  discharge  - Anticipated need for postoperative skilled nursing care or inpatient rehab  - Active co-morbidities: None OR   - Unanticipated findings during/Post Surgery: None  - Patient is a high risk of re-admission due to: None    DVT Prophylaxis - Aspirin Weight bearing as tolerated. D/C O2 and pulse ox and try on room air. Hemovac pulled without difficulty, will continue therapy today.  Plan is to go Home after hospital stay. Possible discharge this afternoon if progresses with therapy and is meeting his goals. Scheduled for outpatient physical therapy at [facility]. Follow-up in the office in 2 weeks.  Arther Abbott, PA-C Orthopedic Surgery 08/17/2018, 7:25 AM

## 2018-08-17 NOTE — Progress Notes (Signed)
Physical Therapy Treatment Patient Details Name: Andrew Barnes MRN: [id number] DOB: 29-Jan-1956 Today's Date: 08/17/2018    History of Present Illness 62 Y.o. male s/p L TKR on 08/06/18. PMH includes HLD, OA, L RTC surgery, multiple L knee surgeries.     PT Comments    POD # 1 am session Pt able to perform SLR so instructed to wear KI for stairs today.  Assisted with amb an increased distance and performed all supine TKR TE's following HEP handout.  Instructed on proper tech, freq as well as use of ICE.   Follow Up Recommendations  Follow surgeon's recommendation for DC plan and follow-up therapies;Supervision for mobility/OOB(OPPT)     Equipment Recommendations  None recommended by PT    Recommendations for Other Services       Precautions / Restrictions Precautions Precautions: Fall Precaution Comments: pt able to perform SLR so instructed to wear KI just for stairs for today to increase safety/knee stability during task Required Braces or Orthoses: Knee Immobilizer - Left Knee Immobilizer - Left: On when out of bed or walking;Discontinue once straight leg raise with < 10 degree lag Restrictions Weight Bearing Restrictions: No Other Position/Activity Restrictions: WBAT     Mobility  Bed Mobility Overal bed mobility: Needs Assistance       Supine to sit: Min assist;HOB elevated     General bed mobility comments: Min assist for LLE management.   Transfers Overall transfer level: Needs assistance Equipment used: Rolling walker (2 wheeled) Transfers: Sit to/from Stand Sit to Stand: Min guard;From elevated surface;Supervision         General transfer comment: Min guard for safety. Verbal cuing for hand placement.   Ambulation/Gait Ambulation/Gait assistance: Supervision;Min guard Gait Distance (Feet): 75 Feet Assistive device: Rolling walker (2 wheeled) Gait Pattern/deviations: Step-to pattern;Decreased stride length;Decreased weight shift to left;Decreased  stance time - left Gait velocity: decr    General Gait Details: Min guard for safety. Verbal cuing for placement in RW and safety with turns   Stairs             Wheelchair Mobility    Modified Rankin (Stroke Patients Only)       Balance                                            Cognition Arousal/Alertness: Awake/alert Behavior During Therapy: WFL for tasks assessed/performed Overall Cognitive Status: Within Functional Limits for tasks assessed                                        Exercises   Total Knee Replacement TE's 10 reps B LE ankle pumps 10 reps towel squeezes 10 reps knee presses 10 reps heel slides  10 reps SAQ's 10 reps SLR's 10 reps ABD Followed by ICE     General Comments        Pertinent Vitals/Pain Pain Assessment: 0-10 Pain Score: 4  Pain Location: L knee Pain Descriptors / Indicators: Aching;Sore;Operative site guarding Pain Intervention(s): Monitored during session;Repositioned;Ice applied;Premedicated before session    Home Living                      Prior Function            PT Goals (current goals can now  be found in the care plan section) Progress towards PT goals: Progressing toward goals    Frequency    7X/week      PT Plan Current plan remains appropriate    Co-evaluation              AM-PAC PT "6 Clicks" Daily Activity  Outcome Measure  Difficulty turning over in bed (including adjusting bedclothes, sheets and blankets)?: Unable Difficulty moving from lying on back to sitting on the side of the bed? : Unable Difficulty sitting down on and standing up from a chair with arms (e.g., wheelchair, bedside commode, etc,.)?: Unable Help needed moving to and from a bed to chair (including a wheelchair)?: None Help needed walking in hospital room?: A Little Help needed climbing 3-5 steps with a railing? : A Little 6 Click Score: 13    End of Session Equipment  Utilized During Treatment: Gait belt Activity Tolerance: Patient tolerated treatment well Patient left: in chair;with chair alarm set;with call bell/phone within reach;with family/visitor present;with SCD's reapplied Nurse Communication: Mobility status PT Visit Diagnosis: Other abnormalities of gait and mobility (R26.89);Difficulty in walking, not elsewhere classified (R26.2)     Time: 1110-1140 PT Time Calculation (min) (ACUTE ONLY): 30 min  Charges:  $Gait Training: 8-22 mins $Therapeutic Exercise: 8-22 mins                     Felecia Shelling  PTA Acute  Rehabilitation Services Pager      [phone] Office      [phone]

## 2018-08-17 NOTE — Progress Notes (Signed)
Physical Therapy Treatment Patient Details Name: Andrew Barnes MRN: [id number] DOB: [birth year] Today's Date: 08/17/2018    History of Present Illness 62 Y.o. male s/p L TKR on 08/06/18. PMH includes HLD, OA, L RTC surgery, multiple L knee surgeries.     PT Comments    POD # 1 pm session Spouse present during session.  Assisted with amb a greater distance in hallway, practiced one step and practiced 4 steps one rail.  Pt has met goals to D/C to home    Follow Up Recommendations  Follow surgeon's recommendation for DC plan and follow-up therapies;Supervision for mobility/OOB(OPPT)     Equipment Recommendations  None recommended by PT    Recommendations for Other Services       Precautions / Restrictions Precautions Precautions: Fall Precaution Comments: pt able to perform SLR so instructed to wear KI just for stairs for today to increase safety/knee stability during task Required Braces or Orthoses: Knee Immobilizer - Left Knee Immobilizer - Left: On when out of bed or walking;Discontinue once straight leg raise with < 10 degree lag Restrictions Weight Bearing Restrictions: No Other Position/Activity Restrictions: WBAT     Mobility  Bed Mobility Overal bed mobility: Needs Assistance       Supine to sit: Min assist;HOB elevated     General bed mobility comments: OOB in recliner   Transfers Overall transfer level: Needs assistance Equipment used: Rolling walker (2 wheeled) Transfers: Sit to/from Stand Sit to Stand: Min guard;From elevated surface;Supervision         General transfer comment: Min guard for safety. Verbal cuing for hand placement.   Ambulation/Gait Ambulation/Gait assistance: Supervision;Min guard Gait Distance (Feet): 145 Feet Assistive device: Rolling walker (2 wheeled) Gait Pattern/deviations: Step-to pattern;Decreased stride length;Decreased weight shift to left;Decreased stance time - left Gait velocity: decr    General Gait Details:  Min guard for safety. Verbal cuing for placement in RW and safety with turns   Stairs Stairs: Yes Stairs assistance: Supervision;Min guard Stair Management: One rail Right;Forwards;With cane Number of Stairs: 4 General stair comments: with spouse present for education performed 4 steps one rail and one step forward with walker.    Wheelchair Mobility    Modified Rankin (Stroke Patients Only)       Balance                                            Cognition Arousal/Alertness: Awake/alert Behavior During Therapy: WFL for tasks assessed/performed Overall Cognitive Status: Within Functional Limits for tasks assessed                                        Exercises      General Comments        Pertinent Vitals/Pain Pain Assessment: 0-10 Pain Score: 4  Pain Location: L knee Pain Descriptors / Indicators: Aching;Sore;Operative site guarding Pain Intervention(s): Monitored during session;Repositioned;Ice applied;Premedicated before session    Home Living                      Prior Function            PT Goals (current goals can now be found in the care plan section) Progress towards PT goals: Progressing toward goals    Frequency  7X/week      PT Plan Current plan remains appropriate    Co-evaluation              AM-PAC PT "6 Clicks" Daily Activity  Outcome Measure  Difficulty turning over in bed (including adjusting bedclothes, sheets and blankets)?: Unable Difficulty moving from lying on back to sitting on the side of the bed? : Unable Difficulty sitting down on and standing up from a chair with arms (e.g., wheelchair, bedside commode, etc,.)?: Unable Help needed moving to and from a bed to chair (including a wheelchair)?: None Help needed walking in hospital room?: A Little Help needed climbing 3-5 steps with a railing? : A Little 6 Click Score: 13    End of Session Equipment Utilized During  Treatment: Gait belt Activity Tolerance: Patient tolerated treatment well Patient left: in chair;with chair alarm set;with call bell/phone within reach;with family/visitor present;with SCD's reapplied Nurse Communication: Mobility status PT Visit Diagnosis: Other abnormalities of gait and mobility (R26.89);Difficulty in walking, not elsewhere classified (R26.2)     Time: 7125-2712 PT Time Calculation (min) (ACUTE ONLY): 25 min  Charges:  $Gait Training: 8-22 mins $Therapeutic Activity: 8-22 mins                     Rica Koyanagi  PTA Acute  Rehabilitation Services Pager      [phone] Office      [phone]

## 2018-08-17 NOTE — Care Management Note (Signed)
Case Management Note  Patient Details  Name: Andrew Barnes MRN: [id number] Date of Birth: Jun 18, 1956  Subjective/Objective:  Spoke with patient at bedside. Confirmed plan for OP PT, already arranged. Has RW and 3n1. [phone]                  Action/Plan:   Expected Discharge Date:  08/17/18               Expected Discharge Plan:  OP Rehab  In-House Referral:  NA  Discharge planning Services  CM Consult  Post Acute Care Choice:  NA Choice offered to:  Patient  DME Arranged:  N/A DME Agency:  NA  HH Arranged:  NA HH Agency:  NA  Status of Service:  Completed, signed off  If discussed at Long Length of Stay Meetings, dates discussed:    Additional Comments:  Alexis Goodell, RN 08/17/2018, 12:03 PM

## 2018-08-20 DIAGNOSIS — M25562 Pain in left knee: Secondary | ICD-10-CM | POA: Diagnosis not present

## 2018-08-23 NOTE — Discharge Summary (Signed)
Physician Discharge Summary   Patient ID: Andrew Barnes MRN: [id number] DOB/AGE: 64/25/1957 64 y.o.  Admit date: 08/16/2018 Discharge date: 08/17/2018  Primary Diagnosis: Osteoarthritis, left knee   Admission Diagnoses:  Past Medical History:  Diagnosis Date  . Arthritis    oa  . History of kidney stones   . Hyperlipidemia    Discharge Diagnoses:   Principal Problem:   OA (osteoarthritis) of knee  Estimated body mass index is 28.11 kg/m as calculated from the following:   Height as of this encounter: 5' 6" (1.676 m).   Weight as of this encounter: 79 kg.  Procedure:  Procedure(s) (LRB): LEFT TOTAL KNEE ARTHROPLASTY (Left)   Consults: None  HPI: Andrew T Macchia is a 62 y.o. year old male with end stage OA of his left knee with progressively worsening pain and dysfunction. He has constant pain, with activity and at rest and significant functional deficits with difficulties even with ADLs. He has had extensive non-op management including analgesics, injections of cortisone and viscosupplements, and home exercise program, but remains in significant pain with significant dysfunction. Radiographs show bone on bone arthritis medial and patellofemoral. He presents now for left Total Knee Arthroplasty.  Laboratory Data: Admission on 08/16/2018, Discharged on 08/17/2018  Component Date Value Ref Range Status  . WBC 08/17/2018 15.7* 4.0 - 10.5 K/uL Final  . RBC 08/17/2018 3.99* 4.22 - 5.81 MIL/uL Final  . Hemoglobin 08/17/2018 12.8* 13.0 - 17.0 g/dL Final  . HCT 08/17/2018 38.1* 39.0 - 52.0 % Final  . MCV 08/17/2018 95.5  80.0 - 100.0 fL Final  . MCH 08/17/2018 32.1  26.0 - 34.0 pg Final  . MCHC 08/17/2018 33.6  30.0 - 36.0 g/dL Final  . RDW 08/17/2018 12.3  11.5 - 15.5 % Final  . Platelets 08/17/2018 252  150 - 400 K/uL Final  . nRBC 08/17/2018 0.0  0.0 - 0.2 % Final   Performed at [facility], [address] [place], [place] [address]  . Sodium  08/17/2018 141  135 - 145 mmol/L Final  . Potassium 08/17/2018 4.7  3.5 - 5.1 mmol/L Final  . Chloride 08/17/2018 105  98 - 111 mmol/L Final  . CO2 08/17/2018 26  22 - 32 mmol/L Final  . Glucose, Bld 08/17/2018 160* 70 - 99 mg/dL Final  . BUN 08/17/2018 15  8 - 23 mg/dL Final  . Creatinine, Ser 08/17/2018 1.13  0.61 - 1.24 mg/dL Final  . Calcium 08/17/2018 9.5  8.9 - 10.3 mg/dL Final  . GFR calc non Af Amer 08/17/2018 >60  >60 mL/min Final  . GFR calc Af Amer 08/17/2018 >60  >60 mL/min Final   Comment: (NOTE) The eGFR has been calculated using the CKD EPI equation. This calculation has not been validated in all clinical situations. eGFR's persistently <60 mL/min signify possible Chronic Kidney Disease.   . Anion gap 08/17/2018 10  5 - 15 Final   Performed at [facility], [address] [place], [place] [address]  Hospital Outpatient Visit on 08/11/2018  Component Date Value Ref Range Status  . MRSA, PCR 08/11/2018 NEGATIVE  NEGATIVE Final  . Staphylococcus aureus 08/11/2018 NEGATIVE  NEGATIVE Final   Comment: (NOTE) The Xpert SA Assay (FDA approved for NASAL specimens in patients 22 years of age and older), is one component of a comprehensive surveillance program. It is not intended to diagnose infection nor to guide or monitor treatment. Performed at  [facility], [address]   [place], [place] [address]   . aPTT 08/11/2018 31  24 - 36 seconds Final   Performed at [facility], [place] [address] [place], [place] [address]  . WBC 08/11/2018 4.3  4.0 - 10.5 K/uL Final  . RBC 08/11/2018 4.64  4.22 - 5.81 MIL/uL Final  . Hemoglobin 08/11/2018 14.5  13.0 - 17.0 g/dL Final  . HCT 08/11/2018 44.0  39.0 - 52.0 % Final  . MCV 08/11/2018 94.8  80.0 - 100.0 fL Final  . MCH 08/11/2018 31.3  26.0 - 34.0 pg Final  . MCHC 08/11/2018 33.0  30.0 - 36.0 g/dL Final  . RDW 08/11/2018 12.2  11.5 - 15.5 % Final  . Platelets 08/11/2018 244   150 - 400 K/uL Final  . nRBC 08/11/2018 0.0  0.0 - 0.2 % Final   Performed at [facility], [place] [address] [place], [place] [address]  . Sodium 08/11/2018 139  135 - 145 mmol/L Final  . Potassium 08/11/2018 4.5  3.5 - 5.1 mmol/L Final  . Chloride 08/11/2018 104  98 - 111 mmol/L Final  . CO2 08/11/2018 26  22 - 32 mmol/L Final  . Glucose, Bld 08/11/2018 104* 70 - 99 mg/dL Final  . BUN 08/11/2018 17  8 - 23 mg/dL Final  . Creatinine, Ser 08/11/2018 1.04  0.61 - 1.24 mg/dL Final  . Calcium 08/11/2018 10.0  8.9 - 10.3 mg/dL Final  . Total Protein 08/11/2018 7.5  6.5 - 8.1 g/dL Final  . Albumin 08/11/2018 4.4  3.5 - 5.0 g/dL Final  . AST 08/11/2018 24  15 - 41 U/L Final  . ALT 08/11/2018 34  0 - 44 U/L Final  . Alkaline Phosphatase 08/11/2018 58  38 - 126 U/L Final  . Total Bilirubin 08/11/2018 1.0  0.3 - 1.2 mg/dL Final  . GFR calc non Af Amer 08/11/2018 >60  >60 mL/min Final  . GFR calc Af Amer 08/11/2018 >60  >60 mL/min Final   Comment: (NOTE) The eGFR has been calculated using the CKD EPI equation. This calculation has not been validated in all clinical situations. eGFR's persistently <60 mL/min signify possible Chronic Kidney Disease.   Georgiann Hahn gap 08/11/2018 9  5 - 15 Final   Performed at [facility], [place] [address], [place], [place] [address]  . ABO/RH(D) 08/11/2018 O POS   Final  . Antibody Screen 08/11/2018 NEG   Final  . Sample Expiration 08/11/2018 08/19/2018   Final  . Extend sample reason 08/11/2018    Final                   Value:NO TRANSFUSIONS OR PREGNANCY IN THE PAST 3 MONTHS Performed at [facility], [place] [address] [place], [place] [address]   . Prothrombin Time 08/11/2018 11.8  11.4 - 15.2 seconds Final  . INR 08/11/2018 0.88   Final   Performed at [facility], [place] [address], [place], [place] [address]  . ABO/RH(D) 08/11/2018    Final                   Value:O POS Performed at  [facility], [place] [address], [place], [place] [address]      X-Rays:No results found.  EKG: Orders placed or performed during the hospital encounter of 08/16/18  . EKG  . EKG     Hospital Course: IBRAHAM LEVI is a 62 y.o. who was admitted to [facility].  They were brought to the operating room on 08/16/2018 and underwent Procedure(s): LEFT TOTAL KNEE ARTHROPLASTY.  Patient tolerated the procedure well and was later transferred to the recovery room and then to the orthopaedic floor for postoperative care. They were given PO and IV analgesics for pain control following their surgery. They were given 24 hours of postoperative antibiotics of  Anti-infectives (From admission, onward)   Start     Dose/Rate Route Frequency Ordered Stop   08/16/18 1630  ceFAZolin (ANCEF) IVPB 2g/100 mL premix     2 g 200 mL/hr over 30 Minutes Intravenous Every 6 hours 08/16/18 1346 08/16/18 2159   08/16/18 0815  ceFAZolin (ANCEF) IVPB 2g/100 mL premix     2 g 200 mL/hr over 30 Minutes Intravenous On call to O.R. 08/16/18 0812 08/16/18 1039     and started on DVT prophylaxis in the form of Aspirin.   PT and OT were ordered for total joint protocol. Discharge planning consulted to help with postop disposition and equipment needs.  Patient had a good night on the evening of surgery. They started to get up OOB with therapy on POD #0. Pt was seen during rounds and was ready to go home pending progress with therapy. Hemovac drain was pulled without difficulty. He worked with therapy on POD #1 and was meeting his goals. Pt was discharged to home later that day in stable condition.  Diet: Regular diet Activity: WBAT Follow-up: in 2 weeks with Dr. Aluisio Disposition: Home with outpatient PT at [facility] Discharged Condition: stable   Discharge Instructions    Call MD / Call 911   Complete by:  As directed    If you experience chest pain or shortness of breath, CALL 911 and be  transported to the hospital emergency room.  If you develope a fever above 101 F, pus (white drainage) or increased drainage or redness at the wound, or calf pain, call your surgeon's office.   Change dressing   Complete by:  As directed    Change dressing on Wednesday, then change the dressing daily with sterile 4 x 4 inch gauze dressing and apply TED hose.   Constipation Prevention   Complete by:  As directed    Drink plenty of fluids.  Prune juice may be helpful.  You may use a stool softener, such as Colace (over the counter) 100 mg twice a day.  Use MiraLax (over the counter) for constipation as needed.   Diet - low sodium heart healthy   Complete by:  As directed    Discharge instructions   Complete by:  As directed    Dr. Frank Aluisio Total Joint Specialist [facility] [address] [place], [place] [address] [phone]  TOTAL KNEE REPLACEMENT POSTOPERATIVE DIRECTIONS  Knee Rehabilitation, Guidelines Following Surgery  Results after knee surgery are often greatly improved when you follow the exercise, range of motion and muscle strengthening exercises prescribed by your doctor. Safety measures are also important to protect the knee from further injury. Any time any of these exercises cause you to have increased pain or swelling in your knee joint, decrease the amount until you are comfortable again and slowly increase them. If you have problems or questions, call your caregiver or physical therapist for advice.   HOME CARE INSTRUCTIONS  Remove items at home which could result in a fall. This includes throw rugs or furniture in walking pathways.  ICE to the affected knee every three hours for 30 minutes at a   time and then as needed for pain and swelling.  Continue to use ice on the knee for pain and swelling from surgery. You may notice swelling that will progress down to the foot and ankle.  This is normal after surgery.  Elevate the leg when you are not up walking on  it.   Continue to use the breathing machine which will help keep your temperature down.  It is common for your temperature to cycle up and down following surgery, especially at night when you are not up moving around and exerting yourself.  The breathing machine keeps your lungs expanded and your temperature down. Do not place pillow under knee, focus on keeping the knee straight while resting   DIET You may resume your previous home diet once your are discharged from the hospital.  DRESSING / WOUND CARE / SHOWERING You may shower 3 days after surgery, but keep the wounds dry during showering.  You may use an occlusive plastic wrap (Press'n Seal for example), NO SOAKING/SUBMERGING IN THE BATHTUB.  If the bandage gets wet, change with a clean dry gauze.  If the incision gets wet, pat the wound dry with a clean towel. You may start showering once you are discharged home but do not submerge the incision under water. Just pat the incision dry and apply a dry gauze dressing on daily. Change the surgical dressing daily and reapply a dry dressing each time.  ACTIVITY Walk with your walker as instructed. Use walker as long as suggested by your caregivers. Avoid periods of inactivity such as sitting longer than an hour when not asleep. This helps prevent blood clots.  You may resume a sexual relationship in one month or when given the OK by your doctor.  You may return to work once you are cleared by your doctor.  Do not drive a car for 6 weeks or until released by you surgeon.  Do not drive while taking narcotics.  WEIGHT BEARING Weight bearing as tolerated with assist device (walker, cane, etc) as directed, use it as long as suggested by your surgeon or therapist, typically at least 4-6 weeks.  POSTOPERATIVE CONSTIPATION PROTOCOL Constipation - defined medically as fewer than three stools per week and severe constipation as less than one stool per week.  One of the most common issues patients  have following surgery is constipation.  Even if you have a regular bowel pattern at home, your normal regimen is likely to be disrupted due to multiple reasons following surgery.  Combination of anesthesia, postoperative narcotics, change in appetite and fluid intake all can affect your bowels.  In order to avoid complications following surgery, here are some recommendations in order to help you during your recovery period.  Colace (docusate) - Pick up an over-the-counter form of Colace or another stool softener and take twice a day as long as you are requiring postoperative pain medications.  Take with a full glass of water daily.  If you experience loose stools or diarrhea, hold the colace until you stool forms back up.  If your symptoms do not get better within 1 week or if they get worse, check with your doctor.  Dulcolax (bisacodyl) - Pick up over-the-counter and take as directed by the product packaging as needed to assist with the movement of your bowels.  Take with a full glass of water.  Use this product as needed if not relieved by Colace only.   MiraLax (polyethylene glycol) - Pick up over-the-counter to have on  hand.  MiraLax is a solution that will increase the amount of water in your bowels to assist with bowel movements.  Take as directed and can mix with a glass of water, juice, soda, coffee, or tea.  Take if you go more than two days without a movement. Do not use MiraLax more than once per day. Call your doctor if you are still constipated or irregular after using this medication for 7 days in a row.  If you continue to have problems with postoperative constipation, please contact the office for further assistance and recommendations.  If you experience "the worst abdominal pain ever" or develop nausea or vomiting, please contact the office immediatly for further recommendations for treatment.  ITCHING  If you experience itching with your medications, try taking only a single pain  pill, or even half a pain pill at a time.  You can also use Benadryl over the counter for itching or also to help with sleep.   TED HOSE STOCKINGS Wear the elastic stockings on both legs for three weeks following surgery during the day but you may remove then at night for sleeping.  MEDICATIONS See your medication summary on the "After Visit Summary" that the nursing staff will review with you prior to discharge.  You may have some home medications which will be placed on hold until you complete the course of blood thinner medication.  It is important for you to complete the blood thinner medication as prescribed by your surgeon.  Continue your approved medications as instructed at time of discharge.  PRECAUTIONS If you experience chest pain or shortness of breath - call 911 immediately for transfer to the hospital emergency department.  If you develop a fever greater that 101 F, purulent drainage from wound, increased redness or drainage from wound, foul odor from the wound/dressing, or calf pain - CONTACT YOUR SURGEON.                                                   FOLLOW-UP APPOINTMENTS Make sure you keep all of your appointments after your operation with your surgeon and caregivers. You should call the office at the above phone number and make an appointment for approximately two weeks after the date of your surgery or on the date instructed by your surgeon outlined in the "After Visit Summary".   RANGE OF MOTION AND STRENGTHENING EXERCISES  Rehabilitation of the knee is important following a knee injury or an operation. After just a few days of immobilization, the muscles of the thigh which control the knee become weakened and shrink (atrophy). Knee exercises are designed to build up the tone and strength of the thigh muscles and to improve knee motion. Often times heat used for twenty to thirty minutes before working out will loosen up your tissues and help with improving the range of  motion but do not use heat for the first two weeks following surgery. These exercises can be done on a training (exercise) mat, on the floor, on a table or on a bed. Use what ever works the best and is most comfortable for you Knee exercises include:  Leg Lifts - While your knee is still immobilized in a splint or cast, you can do straight leg raises. Lift the leg to 60 degrees, hold for 3 sec, and slowly lower the leg. Repeat  10-20 times 2-3 times daily. Perform this exercise against resistance later as your knee gets better.  Quad and Hamstring Sets - Tighten up the muscle on the front of the thigh (Quad) and hold for 5-10 sec. Repeat this 10-20 times hourly. Hamstring sets are done by pushing the foot backward against an object and holding for 5-10 sec. Repeat as with quad sets.  Leg Slides: Lying on your back, slowly slide your foot toward your buttocks, bending your knee up off the floor (only go as far as is comfortable). Then slowly slide your foot back down until your leg is flat on the floor again. Angel Wings: Lying on your back spread your legs to the side as far apart as you can without causing discomfort.  A rehabilitation program following serious knee injuries can speed recovery and prevent re-injury in the future due to weakened muscles. Contact your doctor or a physical therapist for more information on knee rehabilitation.   IF YOU ARE TRANSFERRED TO A SKILLED REHAB FACILITY If the patient is transferred to a skilled rehab facility following release from the hospital, a list of the current medications will be sent to the facility for the patient to continue.  When discharged from the skilled rehab facility, please have the facility set up the patient's [place] prior to being released. Also, the skilled facility will be responsible for providing the patient with their medications at time of release from the facility to include their pain medication, the muscle relaxants,  and their blood thinner medication. If the patient is still at the rehab facility at time of the two week follow up appointment, the skilled rehab facility will also need to assist the patient in arranging follow up appointment in our office and any transportation needs.  MAKE SURE YOU:  Understand these instructions.  Get help right away if you are not doing well or get worse.    Pick up stool softner and laxative for home use following surgery while on pain medications. Do not submerge incision under water. Please use good hand washing techniques while changing dressing each day. May shower starting three days after surgery. Please use a clean towel to pat the incision dry following showers. Continue to use ice for pain and swelling after surgery. Do not use any lotions or creams on the incision until instructed by your surgeon.   Do not put a pillow under the knee. Place it under the heel.   Complete by:  As directed    Driving restrictions   Complete by:  As directed    No driving for two weeks   TED hose   Complete by:  As directed    Use stockings (TED hose) for three weeks on both leg(s).  You may remove them at night for sleeping.   Weight bearing as tolerated   Complete by:  As directed      Allergies as of 08/17/2018      Reactions   Erythromycin Nausea And Vomiting   Sulfa Antibiotics    Red splotches      Medication List    TAKE these medications   acetaminophen 500 MG tablet Commonly known as:  TYLENOL Take 1,000 mg by mouth daily as needed for moderate pain or headache.   aspirin 325 MG EC tablet Take 1 tablet (325 mg total) by mouth 2 (two) times daily. Take for three weeks following surgery. Then resume one 81 mg aspirin once a day. What changed:  medication strength  how much to take  when to take this  additional instructions   diphenhydrAMINE 25 MG tablet Commonly known as:  BENADRYL Take 25 mg by mouth at bedtime.   Fish Oil 1000 MG  Caps Take 1,000 mg by mouth daily.   gabapentin 300 MG capsule Commonly known as:  NEURONTIN Take 1 capsule (300 mg total) by mouth 3 (three) times daily. Gabapentin 300 mg Protocol Take a 300 mg capsule three times a day for two weeks following surgery. Then take a 300 mg capsule two times a day for two weeks.  Then take a 300 mg capsule once a day for two weeks.  Then discontinue the Gabapentin.   levothyroxine 50 MCG tablet Commonly known as:  SYNTHROID, LEVOTHROID Take 50 mcg by mouth daily.   methocarbamol 500 MG tablet Commonly known as:  ROBAXIN Take 1 tablet (500 mg total) by mouth every 6 (six) hours as needed for muscle spasms.   multivitamin with minerals Tabs tablet Take 1 tablet by mouth every evening.   oxyCODONE 5 MG immediate release tablet Commonly known as:  Oxy IR/ROXICODONE Take 1-2 tablets (5-10 mg total) by mouth every 6 (six) hours as needed for moderate pain (pain score 4-6).   pravastatin 40 MG tablet Commonly known as:  PRAVACHOL Take 40 mg by mouth at bedtime.   traMADol 50 MG tablet Commonly known as:  ULTRAM Take 1-2 tablets (50-100 mg total) by mouth every 6 (six) hours as needed for moderate pain (refractory to oxycodone).            Discharge Care Instructions  (From admission, onward)         Start     Ordered   08/17/18 0000  Weight bearing as tolerated     08/17/18 0729   08/17/18 0000  Change dressing    Comments:  Change dressing on Wednesday, then change the dressing daily with sterile 4 x 4 inch gauze dressing and apply TED hose.   08/17/18 0729         Follow-up Information    Aluisio, Frank, MD. Schedule an appointment as soon as possible for a visit on 08/31/2018.   Specialty:  Orthopedic Surgery Contact information: [address] [place] [address] [phone]           Signed: Kristie Edmisten, PA-C Orthopedic Surgery 08/23/2018, 10:27 AM    

## 2018-08-24 DIAGNOSIS — M25562 Pain in left knee: Secondary | ICD-10-CM | POA: Diagnosis not present

## 2018-08-26 DIAGNOSIS — M25562 Pain in left knee: Secondary | ICD-10-CM | POA: Diagnosis not present

## 2018-08-31 DIAGNOSIS — M25562 Pain in left knee: Secondary | ICD-10-CM | POA: Diagnosis not present

## 2018-09-03 DIAGNOSIS — M25562 Pain in left knee: Secondary | ICD-10-CM | POA: Diagnosis not present

## 2018-09-07 DIAGNOSIS — M25552 Pain in left hip: Secondary | ICD-10-CM | POA: Diagnosis not present

## 2018-09-07 DIAGNOSIS — M25562 Pain in left knee: Secondary | ICD-10-CM | POA: Diagnosis not present

## 2018-09-07 DIAGNOSIS — R52 Pain, unspecified: Secondary | ICD-10-CM | POA: Diagnosis not present

## 2018-09-07 DIAGNOSIS — M545 Low back pain: Secondary | ICD-10-CM | POA: Diagnosis not present

## 2018-09-09 DIAGNOSIS — M25562 Pain in left knee: Secondary | ICD-10-CM | POA: Diagnosis not present

## 2018-09-13 DIAGNOSIS — M25562 Pain in left knee: Secondary | ICD-10-CM | POA: Diagnosis not present

## 2018-09-15 DIAGNOSIS — M25562 Pain in left knee: Secondary | ICD-10-CM | POA: Diagnosis not present

## 2018-09-20 DIAGNOSIS — M25562 Pain in left knee: Secondary | ICD-10-CM | POA: Diagnosis not present

## 2018-09-21 DIAGNOSIS — Z471 Aftercare following joint replacement surgery: Secondary | ICD-10-CM | POA: Diagnosis not present

## 2018-09-21 DIAGNOSIS — M25562 Pain in left knee: Secondary | ICD-10-CM | POA: Diagnosis not present

## 2018-09-21 DIAGNOSIS — Z96652 Presence of left artificial knee joint: Secondary | ICD-10-CM | POA: Diagnosis not present

## 2018-09-23 DIAGNOSIS — M25552 Pain in left hip: Secondary | ICD-10-CM | POA: Diagnosis not present

## 2019-01-04 ENCOUNTER — Encounter: Payer: Self-pay | Admitting: Physical Therapy

## 2019-01-04 ENCOUNTER — Ambulatory Visit: Payer: Medicare Other | Attending: Orthopedic Surgery | Admitting: Physical Therapy

## 2019-01-04 ENCOUNTER — Other Ambulatory Visit: Payer: Self-pay

## 2019-01-04 DIAGNOSIS — R2689 Other abnormalities of gait and mobility: Secondary | ICD-10-CM | POA: Diagnosis present

## 2019-01-04 DIAGNOSIS — R600 Localized edema: Secondary | ICD-10-CM | POA: Diagnosis not present

## 2019-01-04 DIAGNOSIS — M25562 Pain in left knee: Secondary | ICD-10-CM | POA: Diagnosis present

## 2019-01-04 DIAGNOSIS — R29898 Other symptoms and signs involving the musculoskeletal system: Secondary | ICD-10-CM | POA: Diagnosis present

## 2019-01-04 DIAGNOSIS — M6281 Muscle weakness (generalized): Secondary | ICD-10-CM | POA: Diagnosis present
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
## 2019-01-04 NOTE — Patient Instructions (Signed)
Access Code: [email or web address]  URL: [email or web address]  Date: 01/04/2019  Prepared by: Veda Canning   Exercises  Prone Quadriceps Stretch with Strap - 3 reps - 1 sets - 30-60 seconds hold - 2-3x daily - 7x weekly  Supine Knee Extension Stretch on Towel Roll - 3 reps - 1 sets - 30-60 seconds hold - 3x daily - 7x weekly

## 2019-01-05 ENCOUNTER — Other Ambulatory Visit: Payer: Self-pay

## 2019-01-05 ENCOUNTER — Encounter: Payer: Self-pay | Admitting: Physical Therapy

## 2019-01-05 ENCOUNTER — Ambulatory Visit: Payer: Medicare Other | Admitting: Physical Therapy

## 2019-01-05 DIAGNOSIS — R600 Localized edema: Secondary | ICD-10-CM | POA: Diagnosis not present

## 2019-01-05 DIAGNOSIS — R29898 Other symptoms and signs involving the musculoskeletal system: Secondary | ICD-10-CM

## 2019-01-05 DIAGNOSIS — R2689 Other abnormalities of gait and mobility: Secondary | ICD-10-CM

## 2019-01-05 DIAGNOSIS — M6281 Muscle weakness (generalized): Secondary | ICD-10-CM

## 2019-01-05 DIAGNOSIS — M25562 Pain in left knee: Secondary | ICD-10-CM

## 2019-01-06 ENCOUNTER — Encounter: Payer: Self-pay | Admitting: Physical Therapy
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
## 2019-01-11 ENCOUNTER — Ambulatory Visit: Payer: Medicare Other | Admitting: Physical Therapy

## 2019-01-13 ENCOUNTER — Ambulatory Visit: Payer: Medicare Other | Admitting: Physical Therapy

## 2019-01-18 ENCOUNTER — Ambulatory Visit: Payer: Medicare Other | Admitting: Physical Therapy

## 2019-01-20 ENCOUNTER — Ambulatory Visit: Payer: Medicare Other | Admitting: Physical Therapy

## 2019-01-25 ENCOUNTER — Ambulatory Visit: Payer: Medicare Other | Admitting: Physical Therapy

## 2019-01-27 ENCOUNTER — Ambulatory Visit: Payer: Medicare Other | Admitting: Physical Therapy

## 2019-01-27 ENCOUNTER — Other Ambulatory Visit: Payer: Self-pay

## 2019-01-27 ENCOUNTER — Ambulatory Visit: Payer: Medicare Other | Attending: Orthopedic Surgery

## 2019-01-27 DIAGNOSIS — M6281 Muscle weakness (generalized): Secondary | ICD-10-CM

## 2019-01-27 DIAGNOSIS — M25562 Pain in left knee: Secondary | ICD-10-CM

## 2019-01-27 DIAGNOSIS — R600 Localized edema: Secondary | ICD-10-CM | POA: Diagnosis not present

## 2019-01-27 DIAGNOSIS — R2689 Other abnormalities of gait and mobility: Secondary | ICD-10-CM | POA: Insufficient documentation

## 2019-01-27 NOTE — Patient Instructions (Signed)
Hip Extension: Hamstring Single Leg Deadlift (Eccentric)   Holding weights, stand on affected leg with knee slightly flexed. Lift other leg while slowly bending forward at the hip. Use ___ lb weight. ___ reps per set, ___ sets per day, ___ days per week. Add ___ lbs when you achieve ___ repetitions. Touch floor with weight.  Copyright  VHI. All rights reserved.  Squat: Wide Leg  Copyright  VHI. All rights reserved.  Squat: Half   Arms hanging at sides, squat by dropping hips back as if sitting on a chair. Keep knees over ankles, hips behind heels.  Keep back straight.  Bend at hips and knees will bend with hips.   Repeat ___3-15_ times per set. Do __1-2__ sets per session. Do __2-5__ sessions per week. Use __0-10HIP / KNEE: Flexion / Extension, Squat Unilateral Hip / Glute Extension: Standing - Straight Leg (Machine) Hip Extension (Standing) Healthy Back - Yoga Tree Balance  

## 2019-01-27 NOTE — Therapy (Signed)
[facility] [address] [place], [place], [address] Phone: [phone]   Fax:  [phone]  Physical Therapy Treatment  Patient Details  Name: Andrew Barnes MRN: [id number] Date of Birth: Aug 07, 1956 Referring Provider (PT): Ollen GrossAluisio, Frank, MD   Encounter Date: 01/27/2019  PT End of Session - 01/27/19 1148    Visit Number  3    Number of Visits  9    Date for PT Re-Evaluation  02/03/19    Authorization Type  per Epic, Medicare A and B; BCBS supplement     Authorization Time Period  01/04/19 to 03/05/2019 (MD specified 2x/wk x 4 weeks)    PT Start Time  1145    PT Stop Time  1235    PT Time Calculation (min)  50 min    Activity Tolerance  Patient tolerated treatment well    Behavior During Therapy  [facility] for tasks assessed/performed       Past Medical History:  Diagnosis Date  . Arthritis    oa  . History of kidney stones   . Hyperlipidemia     Past Surgical History:  Procedure Laterality Date  . CYSTOSCOPY     x 1  . EXTRACORPOREAL SHOCK WAVE LITHOTRIPSY    . left knee cartlidge removed  high school  . left knee lateral release    . left knee patella with screw placement  teenager  . left rotator cuff repair    . left wrist cartlidge removed and bone shortened    . right ear drum surgery    . TOTAL KNEE ARTHROPLASTY Left 08/16/2018   Procedure: LEFT TOTAL KNEE ARTHROPLASTY;  Surgeon: Ollen GrossAluisio, Frank, MD;  Location: [facility];  Service: Orthopedics;  Laterality: Left;    There were no vitals filed for this visit.  Subjective Assessment - 01/27/19 1149    Subjective  Pt reports pain daily. walks and uses stationary bike at home. Stiff by end of day.   4 knee surgeries. Last surgery 10/19.  Almost regret  TKA.     Currently in Pain?  Yes    Pain Score  3     Pain Location  Knee    Pain Orientation  Left    Pain Descriptors / Indicators  Aching    Pain Type  Chronic pain    Pain Onset  More than a month ago    Pain  Frequency  Intermittent    Aggravating Factors   incr as day goes on.     Pain Relieving Factors  rest, ice, elevation.                        [facility] Adult PT Treatment/Exercise - 01/27/19 0001      Knee/Hip Exercises: Stretches   Passive Hamstring Stretch  Left;10 seconds;5 reps    Passive Hamstring Stretch Limitations  sit on edge of mat    Quad Stretch  Left;2 reps;30 seconds    Knee: Self-Stretch to increase Flexion  Left;2 reps;30 seconds    Knee: Self-Stretch Limitations  stap at knee and ankle      Knee/Hip Exercises: Aerobic   Recumbent Bike  L2 5 min      Knee/Hip Exercises: Standing   Lateral Step Up  Hand Hold: 1;15 reps;Step Height: 8"    Step Down  Left;Hand Hold: 1;15 reps;Step Height: 4"    Other Standing Knee Exercises  single leg dead lift  Lt x 12  and dead  lift bilateral sit on edge of mat 25 pounds  x12 and with black band x 12       Manual Therapy   Joint Mobilization  to patella inferior and superior glides    Passive ROM  flexion and extension iwth posterior and rotation glides gr3-4             PT Education - 01/27/19 1253    Education Details  hip hinge.   self MWM with strap for flexion    Person(s) Educated  Patient    Methods  Explanation;Demonstration;Tactile cues;Verbal cues;Handout    Comprehension  Returned demonstration;Verbalized understanding          PT Long Term Goals - 01/27/19 1256      PT LONG TERM GOAL #1   Title  Patient will be independent with HEP and able to verbalize plans for continued exercise on discharge from PT (Target all LTGs: 4 weeks, 02/03/2019)    Time  4    Period  Weeks    Status  New      PT LONG TERM GOAL #2   Title  Patient will improve AROM left knee flexion to >=120 degrees and knee extension to 0 degrees to allow full movement during functional tasks.     Time  4    Period  Weeks    Status  New      PT LONG TERM GOAL #3   Title  Patient will ascend/descend steps with alternating  pattern while carrying item requiring both hands    Time  4    Period  Weeks    Status  New      PT LONG TERM GOAL #4   Title  Patient will improve 5 x sit to stand to <=9 seconds (towards norm of 8.4 sec)     Time  4    Period  Weeks    Status  New            Plan - 01/27/19 1148    Clinical Impression Statement  REviewed HEP and added to this for flexion ROM and hip strength.   If he reports increased soreness with hip exercises decrease  or stop theses    PT Treatment/Interventions  ADLs/Self Care Home Management;Aquatic Therapy;Cryotherapy;Electrical Stimulation;Ultrasound;Moist Heat;Gait training;Stair training;Functional mobility training;Therapeutic activities;Therapeutic exercise;Patient/family education;Manual techniques;Scar mobilization;Taping;Dry needling;Passive range of motion;Joint Manipulations    PT Next Visit Plan   warm-up, strengthening quads/hips, manual    PT Home Exercise Plan   Access Code: [email or web address]     Consulted and Agree with Plan of Care  Patient       Patient will benefit from skilled therapeutic intervention in order to improve the following deficits and impairments:  Abnormal gait, Decreased activity tolerance, Decreased mobility, Decreased range of motion, Decreased strength, Decreased scar mobility, Increased edema, Impaired flexibility, Increased fascial restricitons, Pain  Visit Diagnosis: Localized edema  Muscle weakness (generalized)  Left knee pain, unspecified chronicity     Problem List Patient Active Problem List   Diagnosis Date Noted  . OA (osteoarthritis) of knee 08/16/2018    Caprice Red  PT 01/27/2019, 12:58 PM  [facility] [address] [place], [place], [address] Phone: [phone]   Fax:  [phone]  Name: Andrew Barnes MRN: [id number] Date of Birth: 22-Dec-1955

## 2019-02-01 ENCOUNTER — Ambulatory Visit: Payer: Medicare Other | Admitting: Physical Therapy

## 2019-02-07 ENCOUNTER — Other Ambulatory Visit: Payer: Self-pay

## 2019-02-07 ENCOUNTER — Ambulatory Visit: Payer: Medicare Other | Admitting: Physical Therapy

## 2019-02-07 DIAGNOSIS — M6281 Muscle weakness (generalized): Secondary | ICD-10-CM

## 2019-02-07 DIAGNOSIS — M25562 Pain in left knee: Secondary | ICD-10-CM

## 2019-02-07 DIAGNOSIS — R2689 Other abnormalities of gait and mobility: Secondary | ICD-10-CM

## 2019-02-07 DIAGNOSIS — R600 Localized edema: Secondary | ICD-10-CM | POA: Diagnosis not present

## 2019-02-07 NOTE — Therapy (Addendum)
[place], [place], [address] Phone: [phone]   Fax:  [phone]  Physical Therapy Treatment/Progess note Progress note reporting period from to 01/04/19 to 02/07/19.  See below for objective and subjective measurements relating to patients progress with PT. Discharge addendum PHYSICAL THERAPY DISCHARGE SUMMARY  Visits from Start of Care: 4  Current functional level related to goals / functional outcomes: See below   Remaining deficits: See below   Education / Equipment: HEP Plan: Patient agrees to discharge.  Patient goals were not met. Patient is being discharged due to not returning since the last visit.  ?????   Elsie Ra, PT, DPT 06/14/19 12:16 PM     Patient Details  Name: KYZER BLOWE MRN: [id number] Date of Birth: 1956/07/31 Referring Provider (PT): Gaynelle Arabian, MD   Encounter Date: 02/07/2019  PT End of Session - 02/07/19 1212    Visit Number  4    Number of Visits  9    Date for PT Re-Evaluation  03/07/19    Authorization Type  per Epic, Medicare A and B; BCBS supplement     Authorization Time Period  01/04/19 to 03/05/2019 (MD specified 2x/wk x 4 weeks)    PT Start Time  1110   pt 10 min late   PT Stop Time  1155    PT Time Calculation (min)  45 min    Activity Tolerance  Patient tolerated treatment well    Behavior During Therapy  [facility] for tasks assessed/performed       Past Medical History:  Diagnosis Date  . Arthritis    oa  . History of kidney stones   . Hyperlipidemia     Past Surgical History:  Procedure Laterality Date  . CYSTOSCOPY     x 1  . EXTRACORPOREAL SHOCK WAVE LITHOTRIPSY    . left knee cartlidge removed  high school  . left knee lateral release    . left knee patella with screw placement  teenager  . left rotator cuff repair    . left wrist cartlidge removed and bone shortened    . right ear drum surgery    . TOTAL KNEE ARTHROPLASTY Left  08/16/2018   Procedure: LEFT TOTAL KNEE ARTHROPLASTY;  Surgeon: Gaynelle Arabian, MD;  Location: [facility];  Service: Orthopedics;  Laterality: Left;    There were no vitals filed for this visit.  Subjective Assessment - 02/07/19 1205    Subjective  Pt reports he had to miss last week due to back pain but this is somewhat better now, no knee pain if he moves slow but pain if he moves fast, and continued knee stiffness    Pertinent History  L TKA 08/16/18, arthritis, HLD    Currently in Pain?  No/denies         [facility] PT Assessment - 02/07/19 0001      Assessment   Medical Diagnosis  left knee pain    Referring Provider (PT)  Gaynelle Arabian, MD    Onset Date/Surgical Date  08/16/18      AROM   Left Knee Extension  -5    Left Knee Flexion  105      Strength   Left Knee Flexion  4+/5    Left Knee Extension  4+/5                   [facility] Adult PT Treatment/Exercise - 02/07/19 0001      Knee/Hip Exercises: Stretches  Passive Hamstring Stretch  5 reps;20 seconds    Knee: Self-Stretch to increase Flexion  10 seconds    Knee: Self-Stretch Limitations  10 reps standing lunge stretch      Knee/Hip Exercises: Aerobic   Recumbent Bike  L2 5 min      Knee/Hip Exercises: Machines for Strengthening   Cybex Knee Extension  10 lbs bilat X 20    Cybex Knee Flexion  35 lbs X 20    Total Gym Leg Press  omega leg press 25 lbs Lt only X20      Knee/Hip Exercises: Standing   Forward Step Up  15 reps;Hand Hold: 2;Step Height: 8"      Manual Therapy   Joint Mobilization  to patella inferior and superior glides    Passive ROM  flexion and extension iwth posterior and rotation glides gr3-4                  PT Long Term Goals - 02/07/19 1435      PT LONG TERM GOAL #1   Title  Patient will be independent with HEP and able to verbalize plans for continued exercise on discharge from PT (Target all LTGs: 4 weeks, 02/03/2019)    Time  4    Period  Weeks    Status  On-going       PT LONG TERM GOAL #2   Title  Patient will improve AROM left knee flexion to >=120 degrees and knee extension to 0 degrees to allow full movement during functional tasks.     Time  4    Period  Weeks    Status  On-going      PT LONG TERM GOAL #3   Title  Patient will ascend/descend steps with alternating pattern while carrying item requiring both hands    Time  4    Period  Weeks    Status  On-going      PT LONG TERM GOAL #4   Title  Patient will improve 5 x sit to stand to <=9 seconds (towards norm of 8.4 sec)     Period  Weeks    Status  On-going            Plan - 02/07/19 1436    Clinical Impression Statement  RE performed today as his POC has ran out. He has made good progress toward his goals however they are all still ongoing. He shows good effort with PT but has missed a couple of weeks due to pt reported back pain.  He still lacks some strength and ROM and will continue to benefit from skilled PT to address his defecits.     PT Treatment/Interventions  ADLs/Self Care Home Management;Aquatic Therapy;Cryotherapy;Electrical Stimulation;Ultrasound;Moist Heat;Gait training;Stair training;Functional mobility training;Therapeutic activities;Therapeutic exercise;Patient/family education;Manual techniques;Scar mobilization;Taping;Dry needling;Passive range of motion;Joint Manipulations    PT Next Visit Plan   warm-up, strengthening quads/hips, manual    PT Home Exercise Plan   Access Code: [email or web address]     Consulted and Agree with Plan of Care  Patient       Patient will benefit from skilled therapeutic intervention in order to improve the following deficits and impairments:  Abnormal gait, Decreased activity tolerance, Decreased mobility, Decreased range of motion, Decreased strength, Decreased scar mobility, Increased edema, Impaired flexibility, Increased fascial restricitons, Pain  Visit Diagnosis: Muscle weakness (generalized)  Left knee pain, unspecified  chronicity  Other abnormalities of gait and mobility  Localized edema     Problem List Patient Active  Problem List   Diagnosis Date Noted  . OA (osteoarthritis) of knee 08/16/2018    Silvestre Mesi 02/07/2019, 2:39 PM  [facility] [address] [place], [place], [address] Phone: [phone]   Fax:  [phone]  Name: LORAINE FREID MRN: [id number] Date of Birth: July 08, 1956

## 2019-11-15 ENCOUNTER — Ambulatory Visit: Payer: Medicare Other

## 2019-11-15 ENCOUNTER — Ambulatory Visit: Payer: Self-pay

## 2019-11-24 ENCOUNTER — Ambulatory Visit: Payer: Medicare Other

## 2019-12-06 ENCOUNTER — Ambulatory Visit: Payer: Self-pay

## 2019-12-06 ENCOUNTER — Ambulatory Visit: Payer: Medicare Other

## 2020-07-13 ENCOUNTER — Other Ambulatory Visit: Payer: Medicare Other

## 2020-07-13 DIAGNOSIS — Z20822 Contact with and (suspected) exposure to covid-19: Secondary | ICD-10-CM

## 2020-07-14 LAB — NOVEL CORONAVIRUS, NAA: SARS-CoV-2, NAA: NOT DETECTED

## 2020-07-14 LAB — SARS-COV-2, NAA 2 DAY TAT

## 2020-12-03 DIAGNOSIS — H02834 Dermatochalasis of left upper eyelid: Secondary | ICD-10-CM | POA: Diagnosis not present

## 2020-12-03 DIAGNOSIS — H02831 Dermatochalasis of right upper eyelid: Secondary | ICD-10-CM | POA: Diagnosis not present

## 2021-01-15 DIAGNOSIS — M546 Pain in thoracic spine: Secondary | ICD-10-CM | POA: Diagnosis not present

## 2021-02-15 DIAGNOSIS — M546 Pain in thoracic spine: Secondary | ICD-10-CM | POA: Diagnosis not present

## 2021-05-02 DIAGNOSIS — M25521 Pain in right elbow: Secondary | ICD-10-CM | POA: Diagnosis not present

## 2021-05-28 ENCOUNTER — Other Ambulatory Visit: Payer: Self-pay | Admitting: Orthopaedic Surgery

## 2021-05-28 DIAGNOSIS — M25521 Pain in right elbow: Secondary | ICD-10-CM

## 2021-05-28 DIAGNOSIS — M1990 Unspecified osteoarthritis, unspecified site: Secondary | ICD-10-CM

## 2021-05-29 ENCOUNTER — Ambulatory Visit
Admission: RE | Admit: 2021-05-29 | Discharge: 2021-05-29 | Disposition: A | Discharge status: Home / Self Care | Payer: Medicare Other | Source: Ambulatory Visit | Attending: Orthopaedic Surgery | Admitting: Orthopaedic Surgery

## 2021-05-29 ENCOUNTER — Other Ambulatory Visit: Payer: Self-pay

## 2021-05-29 DIAGNOSIS — M19021 Primary osteoarthritis, right elbow: Secondary | ICD-10-CM | POA: Diagnosis not present

## 2021-05-29 DIAGNOSIS — M1990 Unspecified osteoarthritis, unspecified site: Secondary | ICD-10-CM

## 2021-05-29 DIAGNOSIS — M25421 Effusion, right elbow: Secondary | ICD-10-CM | POA: Diagnosis not present

## 2021-05-29 DIAGNOSIS — M25521 Pain in right elbow: Secondary | ICD-10-CM

## 2021-05-29 DIAGNOSIS — R937 Abnormal findings on diagnostic imaging of other parts of musculoskeletal system: Secondary | ICD-10-CM | POA: Diagnosis not present

## 2021-05-29 DIAGNOSIS — M7989 Other specified soft tissue disorders: Secondary | ICD-10-CM | POA: Diagnosis not present

## 2021-06-04 DIAGNOSIS — M25521 Pain in right elbow: Secondary | ICD-10-CM | POA: Diagnosis not present

## 2021-06-12 DIAGNOSIS — N5201 Erectile dysfunction due to arterial insufficiency: Secondary | ICD-10-CM | POA: Diagnosis not present

## 2021-06-12 DIAGNOSIS — N486 Induration penis plastica: Secondary | ICD-10-CM | POA: Diagnosis not present

## 2021-07-24 DIAGNOSIS — E785 Hyperlipidemia, unspecified: Secondary | ICD-10-CM | POA: Diagnosis not present

## 2021-07-24 DIAGNOSIS — E039 Hypothyroidism, unspecified: Secondary | ICD-10-CM | POA: Diagnosis not present

## 2021-07-24 DIAGNOSIS — Z125 Encounter for screening for malignant neoplasm of prostate: Secondary | ICD-10-CM | POA: Diagnosis not present

## 2021-07-31 DIAGNOSIS — Z1331 Encounter for screening for depression: Secondary | ICD-10-CM | POA: Diagnosis not present

## 2021-07-31 DIAGNOSIS — Z1339 Encounter for screening examination for other mental health and behavioral disorders: Secondary | ICD-10-CM | POA: Diagnosis not present

## 2021-07-31 DIAGNOSIS — F419 Anxiety disorder, unspecified: Secondary | ICD-10-CM | POA: Diagnosis not present

## 2021-07-31 DIAGNOSIS — G4700 Insomnia, unspecified: Secondary | ICD-10-CM | POA: Diagnosis not present

## 2021-07-31 DIAGNOSIS — Z0000 Encounter for general adult medical examination without abnormal findings: Secondary | ICD-10-CM | POA: Diagnosis not present

## 2021-07-31 DIAGNOSIS — Z23 Encounter for immunization: Secondary | ICD-10-CM | POA: Diagnosis not present

## 2021-07-31 DIAGNOSIS — M25511 Pain in right shoulder: Secondary | ICD-10-CM | POA: Diagnosis not present

## 2021-07-31 DIAGNOSIS — E039 Hypothyroidism, unspecified: Secondary | ICD-10-CM | POA: Diagnosis not present

## 2021-07-31 DIAGNOSIS — R972 Elevated prostate specific antigen [PSA]: Secondary | ICD-10-CM | POA: Diagnosis not present

## 2021-08-16 DIAGNOSIS — Z23 Encounter for immunization: Secondary | ICD-10-CM | POA: Diagnosis not present

## 2021-09-30 DIAGNOSIS — M24621 Ankylosis, right elbow: Secondary | ICD-10-CM | POA: Diagnosis not present

## 2021-09-30 DIAGNOSIS — M25621 Stiffness of right elbow, not elsewhere classified: Secondary | ICD-10-CM | POA: Diagnosis not present

## 2021-09-30 DIAGNOSIS — G5621 Lesion of ulnar nerve, right upper limb: Secondary | ICD-10-CM | POA: Diagnosis not present

## 2021-09-30 DIAGNOSIS — M1811 Unilateral primary osteoarthritis of first carpometacarpal joint, right hand: Secondary | ICD-10-CM | POA: Diagnosis not present

## 2021-09-30 DIAGNOSIS — M24521 Contracture, right elbow: Secondary | ICD-10-CM | POA: Diagnosis not present

## 2021-09-30 DIAGNOSIS — M7552 Bursitis of left shoulder: Secondary | ICD-10-CM | POA: Diagnosis not present

## 2021-10-01 DIAGNOSIS — M19021 Primary osteoarthritis, right elbow: Secondary | ICD-10-CM | POA: Diagnosis not present

## 2021-10-04 DIAGNOSIS — M19021 Primary osteoarthritis, right elbow: Secondary | ICD-10-CM | POA: Diagnosis not present

## 2021-10-08 DIAGNOSIS — M19021 Primary osteoarthritis, right elbow: Secondary | ICD-10-CM | POA: Diagnosis not present

## 2021-10-09 DIAGNOSIS — M19021 Primary osteoarthritis, right elbow: Secondary | ICD-10-CM | POA: Diagnosis not present

## 2021-10-17 DIAGNOSIS — M19021 Primary osteoarthritis, right elbow: Secondary | ICD-10-CM | POA: Diagnosis not present

## 2021-10-17 DIAGNOSIS — M25511 Pain in right shoulder: Secondary | ICD-10-CM | POA: Diagnosis not present

## 2021-10-23 DIAGNOSIS — M19021 Primary osteoarthritis, right elbow: Secondary | ICD-10-CM | POA: Diagnosis not present

## 2021-11-06 DIAGNOSIS — M19021 Primary osteoarthritis, right elbow: Secondary | ICD-10-CM | POA: Diagnosis not present

## 2021-11-12 DIAGNOSIS — M19021 Primary osteoarthritis, right elbow: Secondary | ICD-10-CM | POA: Diagnosis not present

## 2021-12-09 IMAGING — CT CT 3D INDEPENDENT WKST
4 of 6 series · 16 of 33 positions shown, 18 images · non-contrast
Comparison: None.

CLINICAL DATA: Right elbow pain, osteoarthritis

EXAM:
CT OF THE UPPER RIGHT EXTREMITY WITHOUT CONTRAST
3-DIMENSIONAL CT IMAGE RENDERING ON ACQUISITION WORKSTATION
TECHNIQUE: Multidetector CT imaging of the upper right extremity was performed
according to the standard protocol.
3-dimensional CT images were rendered by post-processing of the
original CT data on an acquisition workstation. The 3-dimensional CT
images were interpreted and findings were reported in the
accompanying complete CT report for this study

[Series 3: elbow right 1.50 br60 s3 axial bone hd fov · axial · 0.35mm/px · z∈[-722,-618]mm · 5 of 196 slices shown, 7 images]
[im 33/196  soft-tissue]
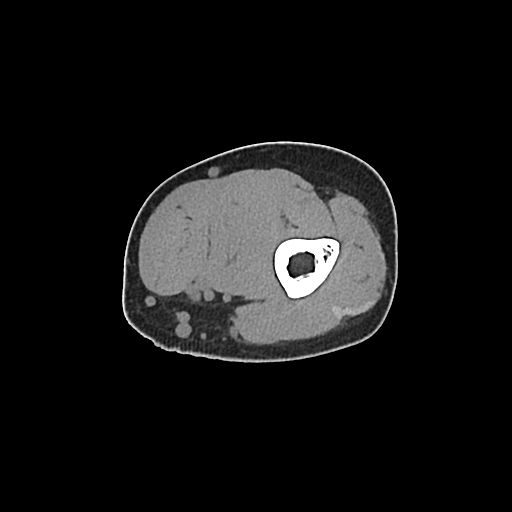
[im 33/196  bone]
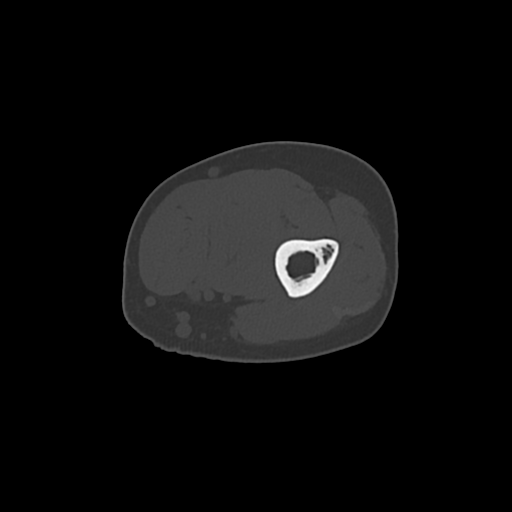
[im 66/196  bone]
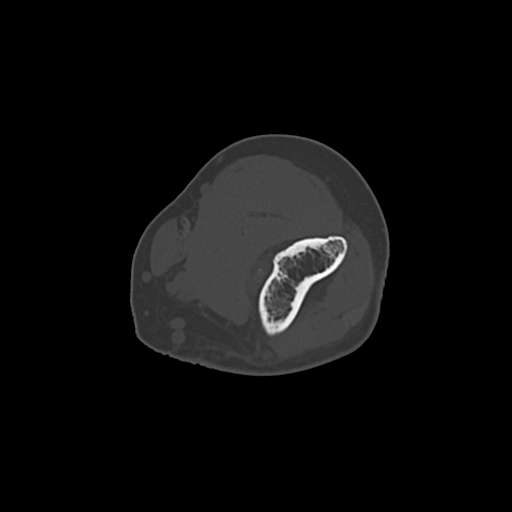
[im 98/196  bone]
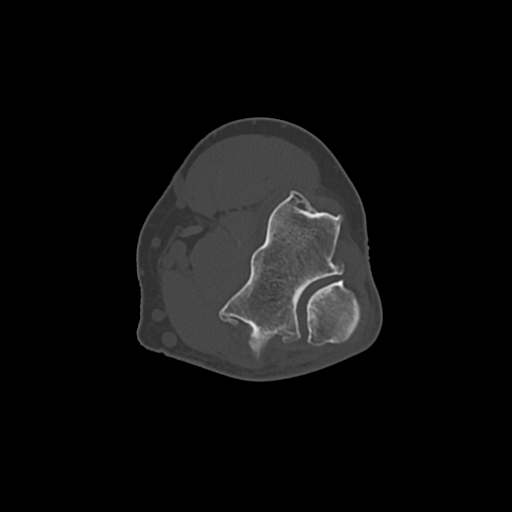
[im 131/196  bone]
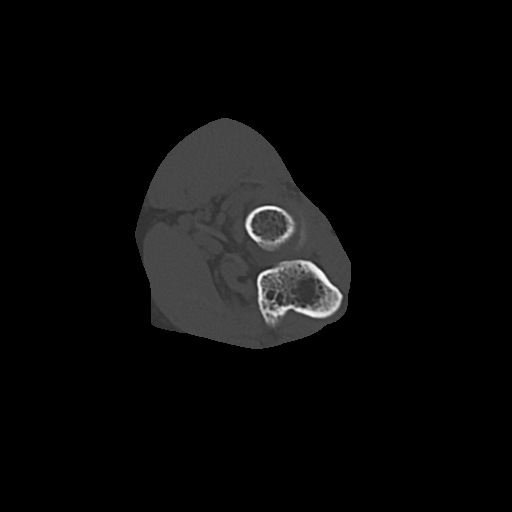
[im 163/196  soft-tissue]
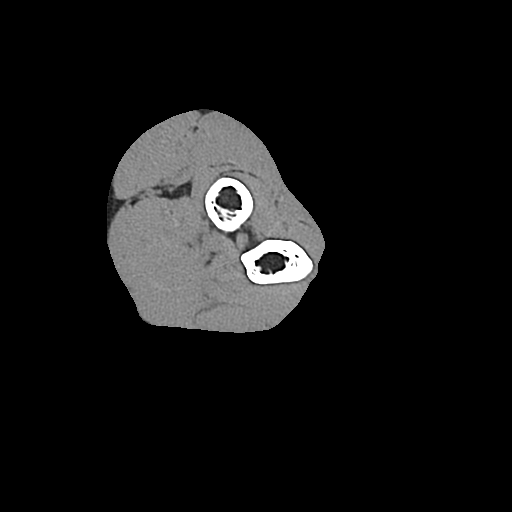
[im 163/196  bone]
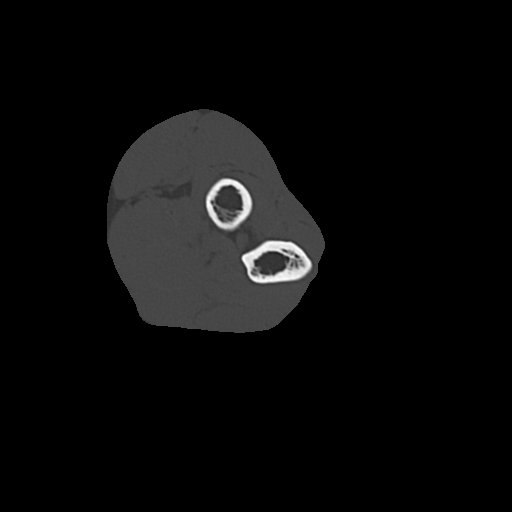

[Series 5: elbow right 1.50 br40 s3 axial st hd fov · axial · 0.35mm/px · z∈[-722,-618]mm · 5 of 196 slices shown]
[im 33/196  bone]
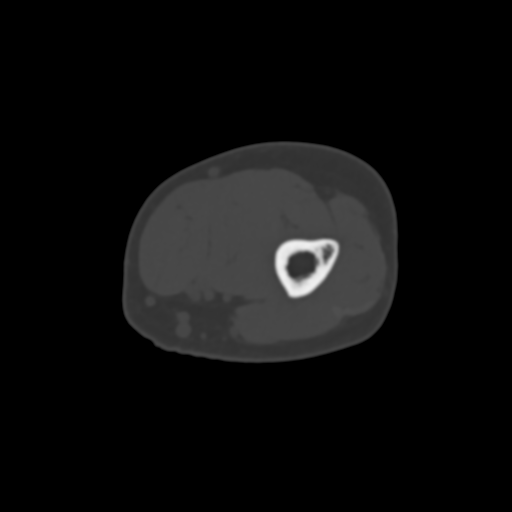
[im 66/196  bone]
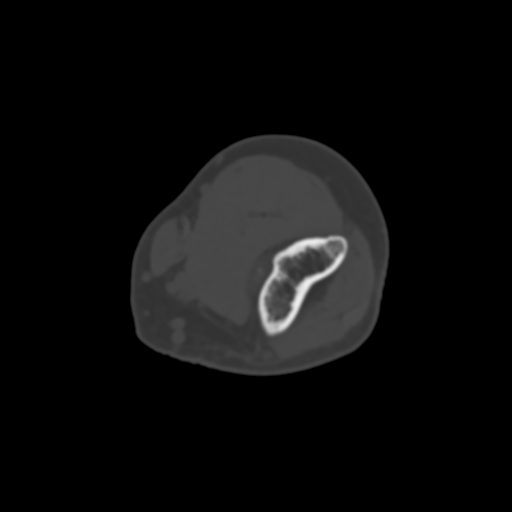
[im 98/196  bone]
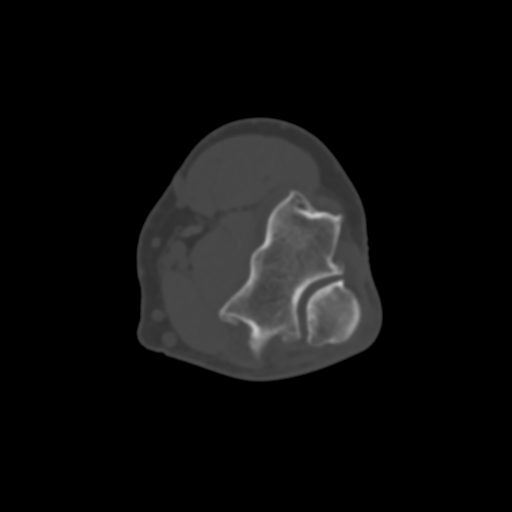
[im 131/196  bone]
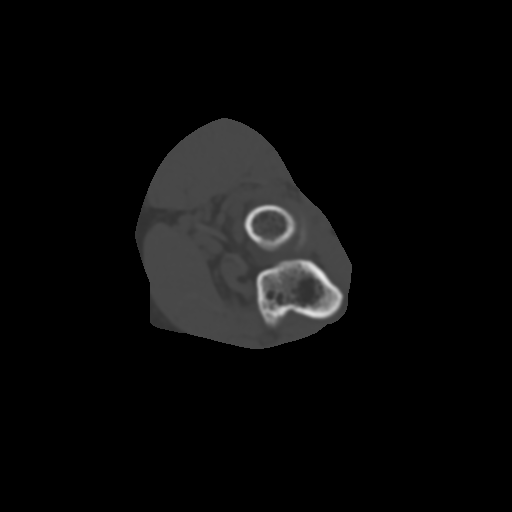
[im 163/196  bone]
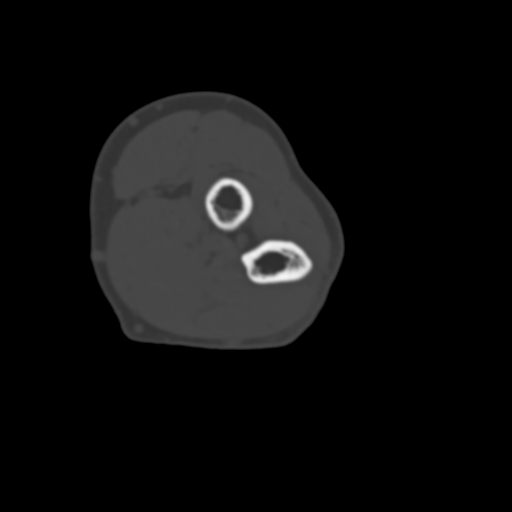

[Series 9: elbow right 1.50 hr60 s3 cor bone hd fov · sagittal · 0.31mm/px · 5 of 220 slices shown]
[im 37/220  bone]
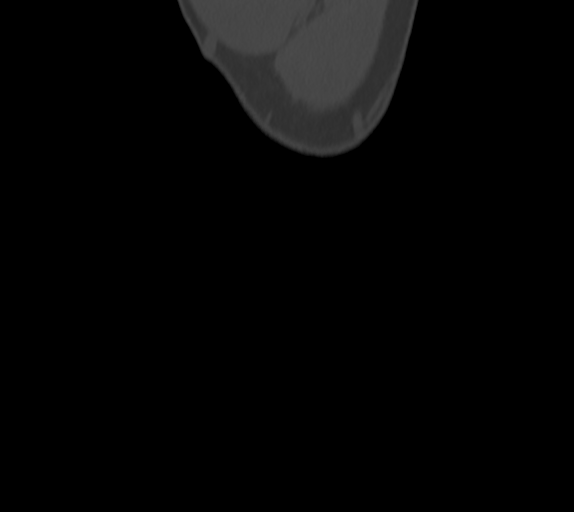
[im 74/220  bone]
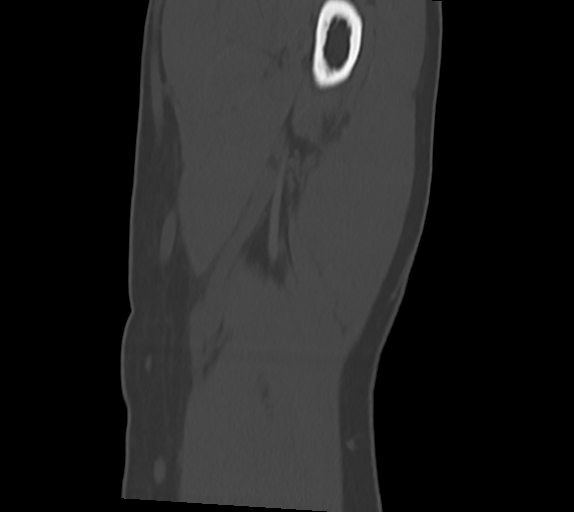
[im 110/220  bone]
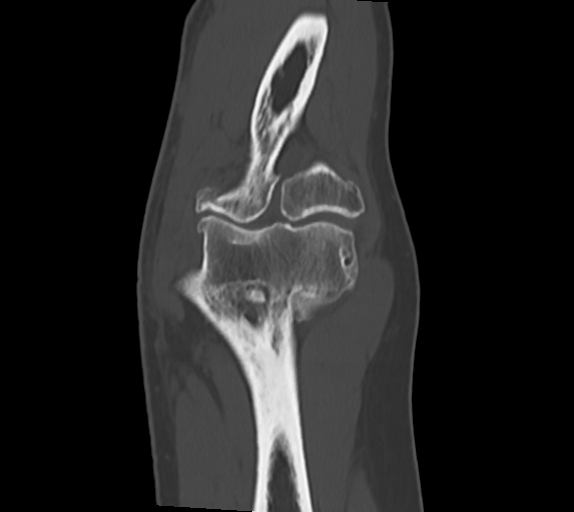
[im 147/220  bone]
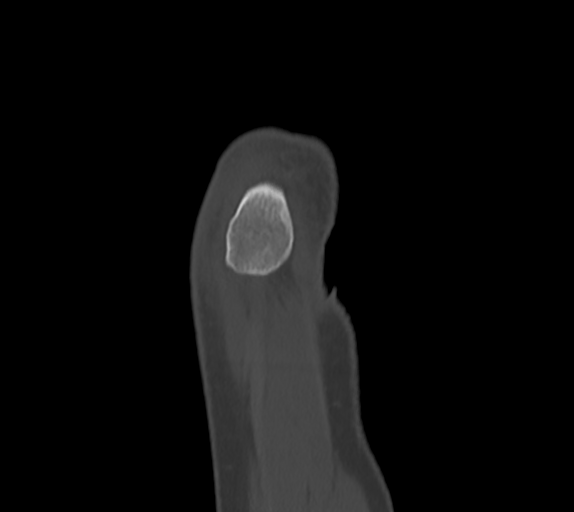
[im 183/220  bone]
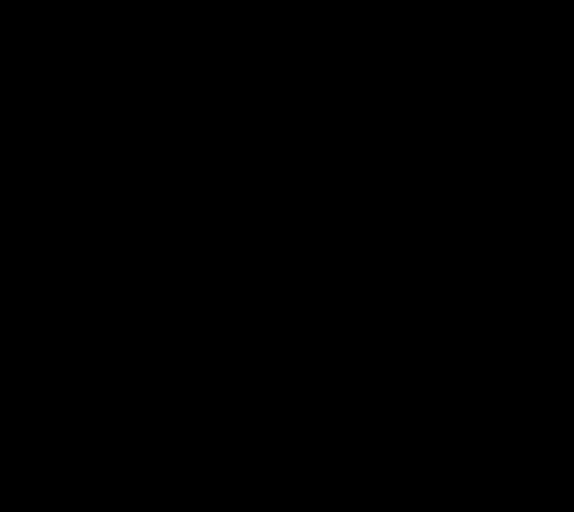

[Series 13: elbow right 1.50 br60 s3 sag bone hd fov · coronal · 0.31mm/px · 1 of 186 slices shown]
[im 93/186  bone]
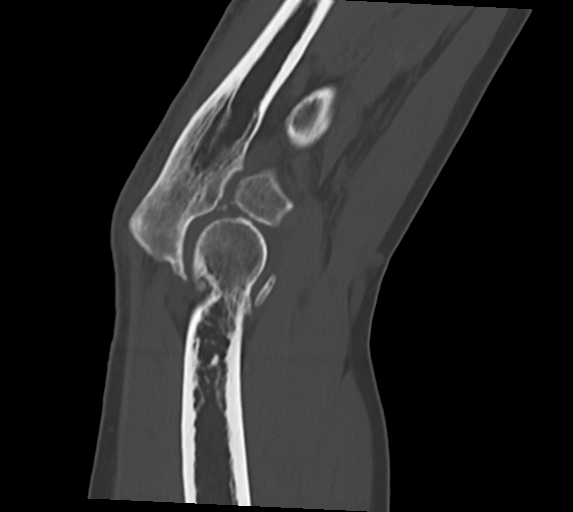

[16 of 33 positions shown; findings below may reference images not displayed]

FINDINGS: Bones/Joint/Cartilage

There is no evidence of acute fracture. There is osteophyte
formation, subchondral sclerosis and cystic change of the
radiocapitellar and ulnar trochlear joints. There is there is a
small joint effusion. The olecranon fossa.

Ligaments

Suboptimally assessed by CT.

Muscles and Tendons

No muscle atrophy.  No acute tendon tear on noncontrast CT.

Soft tissues

Mild soft tissue swelling posteriorly along the olecranon. No focal
fluid collection.
IMPRESSION: Moderate osteoarthritis of the radiocapitellar and ulnotrochlear
joints, with prominent osteophyte formation. Small joint effusion.

## 2021-12-09 IMAGING — CT CT ELBOW*R* W/O CM
4 of 6 series · 16 of 33 positions shown, 18 images · non-contrast
Comparison: None.

CLINICAL DATA: Right elbow pain, osteoarthritis

EXAM:
CT OF THE UPPER RIGHT EXTREMITY WITHOUT CONTRAST
3-DIMENSIONAL CT IMAGE RENDERING ON ACQUISITION WORKSTATION
TECHNIQUE: Multidetector CT imaging of the upper right extremity was performed
according to the standard protocol.
3-dimensional CT images were rendered by post-processing of the
original CT data on an acquisition workstation. The 3-dimensional CT
images were interpreted and findings were reported in the
accompanying complete CT report for this study

[Series 3: elbow right 1.50 br60 s3 axial bone hd fov · axial · 0.35mm/px · z∈[-722,-618]mm · 5 of 196 slices shown, 7 images]
[im 33/196  soft-tissue]
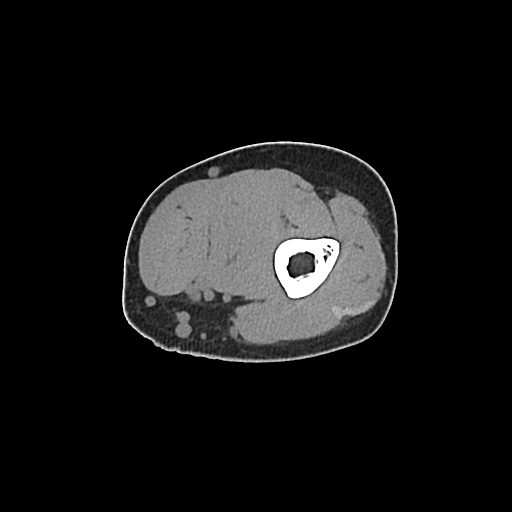
[im 33/196  bone]
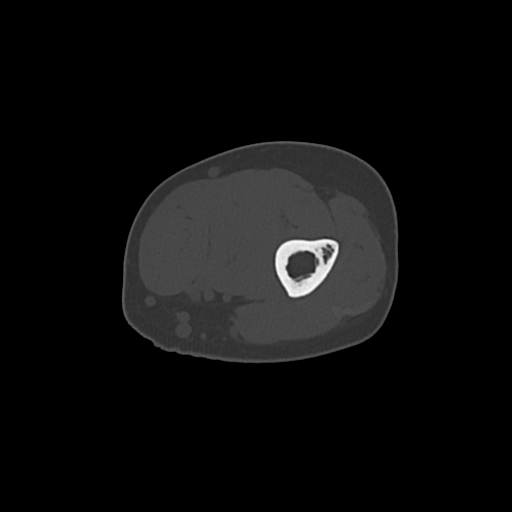
[im 66/196  bone]
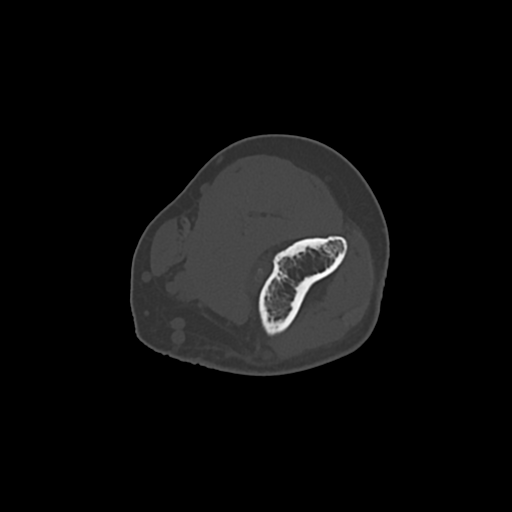
[im 98/196  bone]
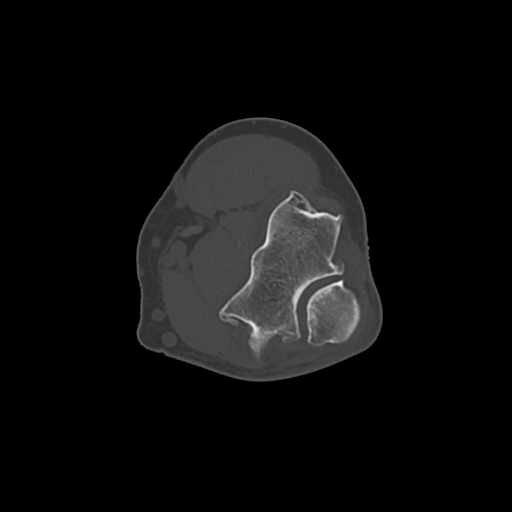
[im 131/196  bone]
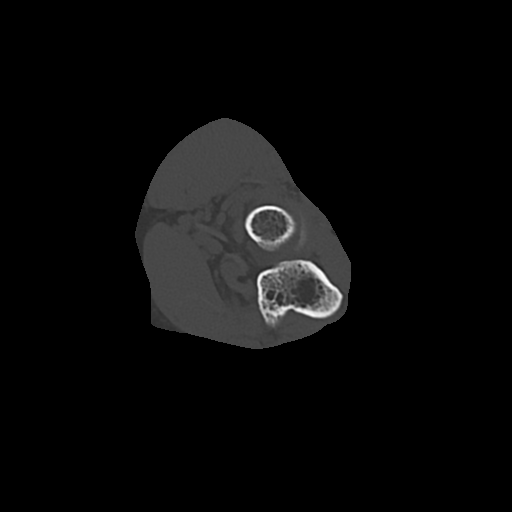
[im 163/196  soft-tissue]
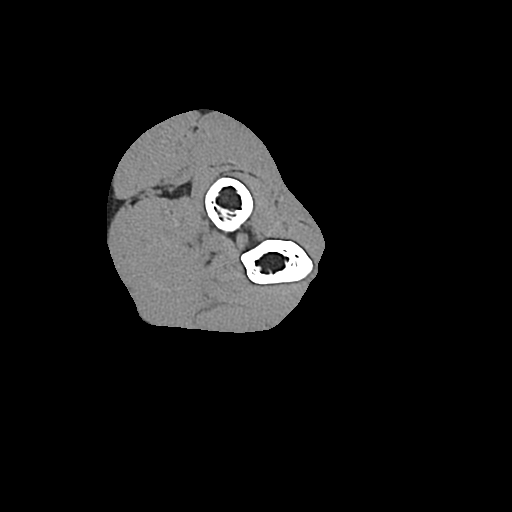
[im 163/196  bone]
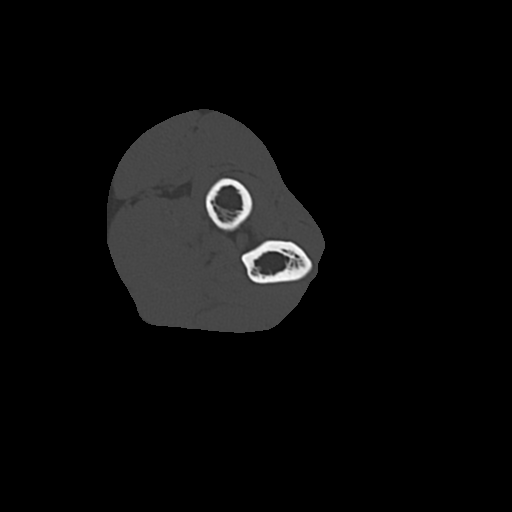

[Series 5: elbow right 1.50 br40 s3 axial st hd fov · axial · 0.35mm/px · z∈[-722,-618]mm · 5 of 196 slices shown]
[im 33/196  bone]
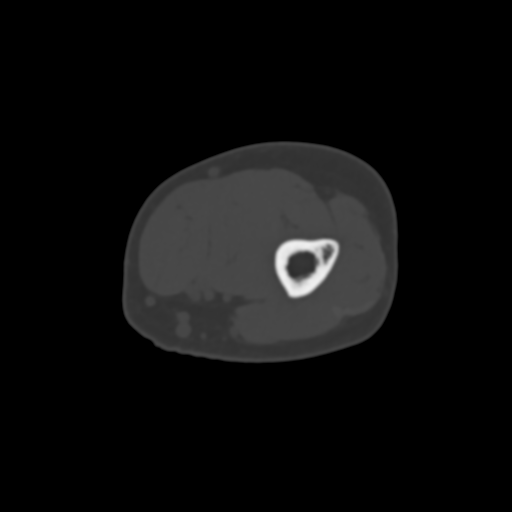
[im 66/196  bone]
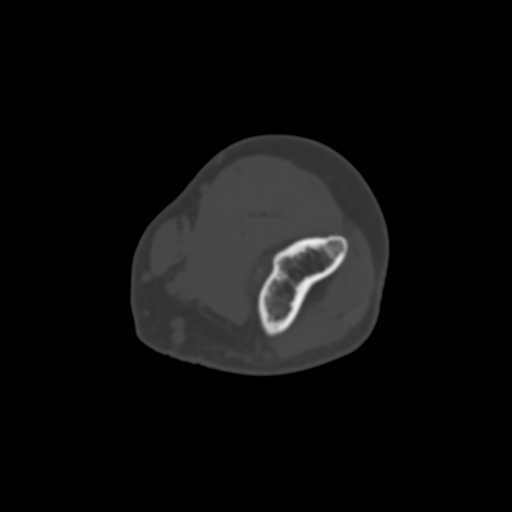
[im 98/196  bone]
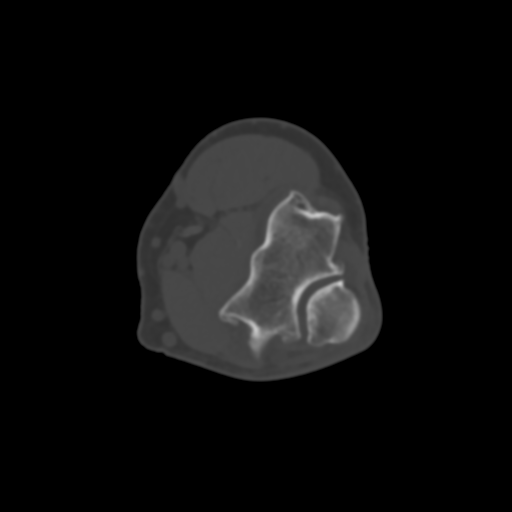
[im 131/196  bone]
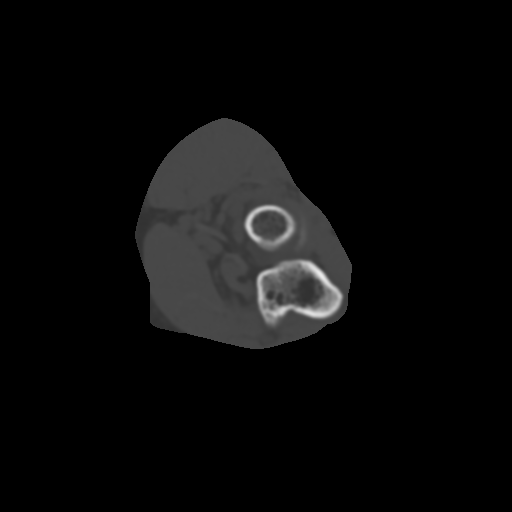
[im 163/196  bone]
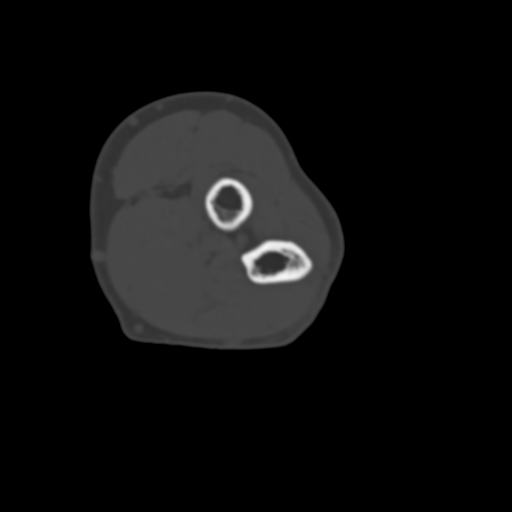

[Series 9: elbow right 1.50 hr60 s3 cor bone hd fov · sagittal · 0.31mm/px · 5 of 220 slices shown]
[im 37/220  bone]
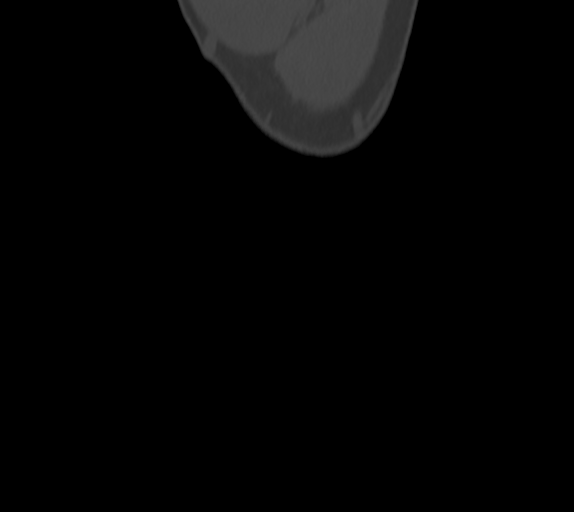
[im 74/220  bone]
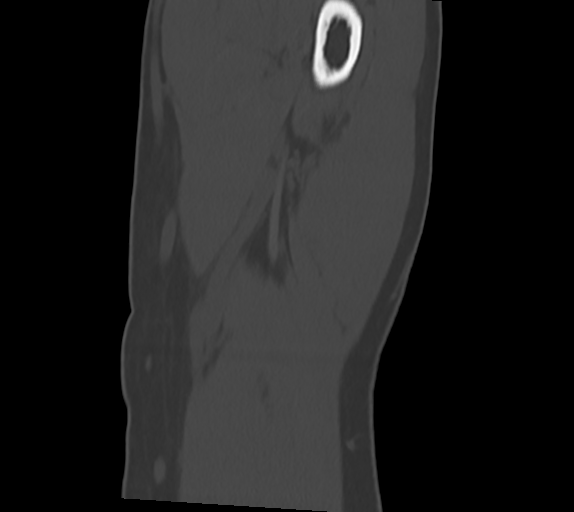
[im 110/220  bone]
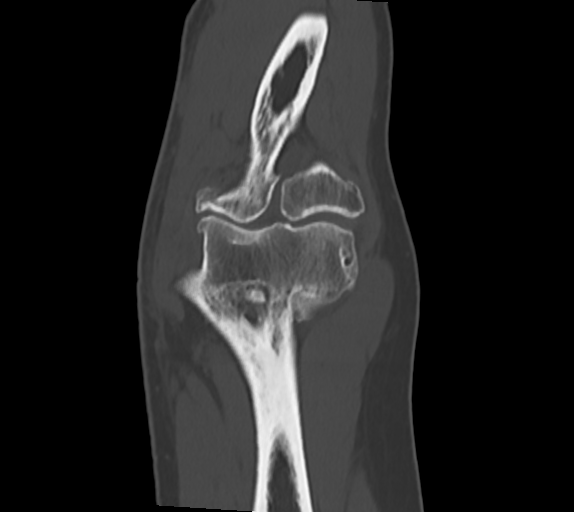
[im 147/220  bone]
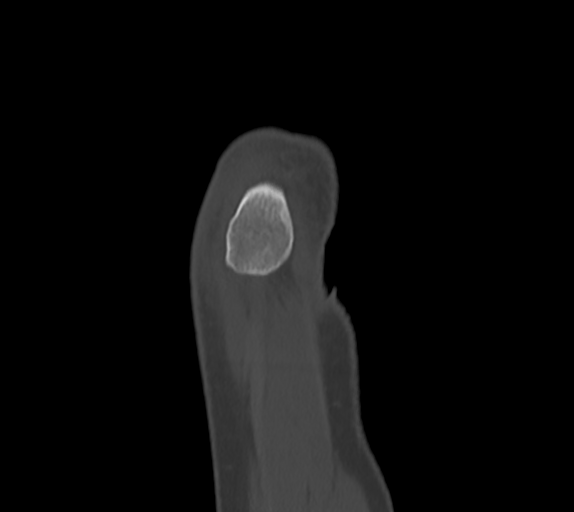
[im 183/220  bone]
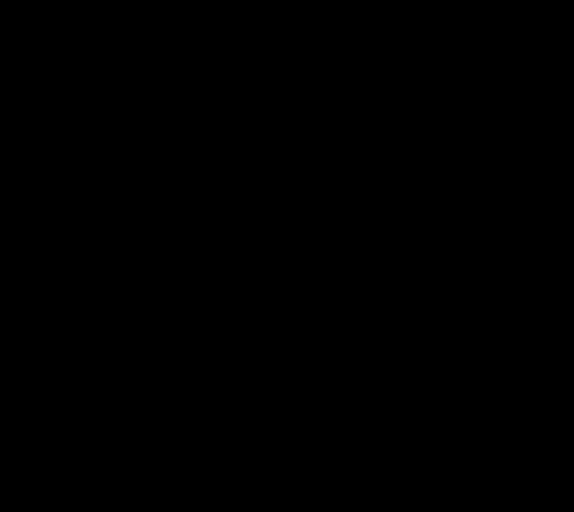

[Series 13: elbow right 1.50 br60 s3 sag bone hd fov · coronal · 0.31mm/px · 1 of 186 slices shown]
[im 93/186  bone]
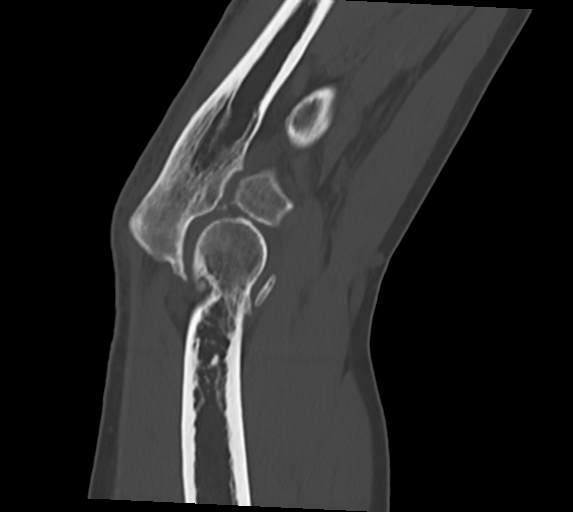

[16 of 33 positions shown; findings below may reference images not displayed]

FINDINGS: Bones/Joint/Cartilage

There is no evidence of acute fracture. There is osteophyte
formation, subchondral sclerosis and cystic change of the
radiocapitellar and ulnar trochlear joints. There is there is a
small joint effusion. The olecranon fossa.

Ligaments

Suboptimally assessed by CT.

Muscles and Tendons

No muscle atrophy.  No acute tendon tear on noncontrast CT.

Soft tissues

Mild soft tissue swelling posteriorly along the olecranon. No focal
fluid collection.
IMPRESSION: Moderate osteoarthritis of the radiocapitellar and ulnotrochlear
joints, with prominent osteophyte formation. Small joint effusion.

## 2022-01-08 DIAGNOSIS — N5082 Scrotal pain: Secondary | ICD-10-CM | POA: Diagnosis not present

## 2022-01-08 DIAGNOSIS — R972 Elevated prostate specific antigen [PSA]: Secondary | ICD-10-CM | POA: Diagnosis not present

## 2022-01-20 ENCOUNTER — Other Ambulatory Visit (HOSPITAL_BASED_OUTPATIENT_CLINIC_OR_DEPARTMENT_OTHER): Payer: Self-pay | Admitting: Internal Medicine

## 2022-01-20 ENCOUNTER — Other Ambulatory Visit: Payer: Self-pay | Admitting: Internal Medicine

## 2022-01-20 DIAGNOSIS — R109 Unspecified abdominal pain: Secondary | ICD-10-CM

## 2022-01-20 DIAGNOSIS — R1030 Lower abdominal pain, unspecified: Secondary | ICD-10-CM | POA: Diagnosis not present

## 2022-01-20 DIAGNOSIS — N5082 Scrotal pain: Secondary | ICD-10-CM | POA: Diagnosis not present

## 2022-01-22 ENCOUNTER — Ambulatory Visit (HOSPITAL_BASED_OUTPATIENT_CLINIC_OR_DEPARTMENT_OTHER)
Admission: RE | Admit: 2022-01-22 | Discharge: 2022-01-22 | Disposition: A | Discharge status: Home / Self Care | Payer: Medicare Other | Source: Ambulatory Visit | Attending: Internal Medicine | Admitting: Internal Medicine

## 2022-01-22 ENCOUNTER — Encounter (HOSPITAL_BASED_OUTPATIENT_CLINIC_OR_DEPARTMENT_OTHER): Payer: Self-pay

## 2022-01-22 DIAGNOSIS — K5730 Diverticulosis of large intestine without perforation or abscess without bleeding: Secondary | ICD-10-CM | POA: Diagnosis not present

## 2022-01-22 DIAGNOSIS — K8020 Calculus of gallbladder without cholecystitis without obstruction: Secondary | ICD-10-CM | POA: Diagnosis not present

## 2022-01-22 DIAGNOSIS — K4021 Bilateral inguinal hernia, without obstruction or gangrene, recurrent: Secondary | ICD-10-CM | POA: Diagnosis not present

## 2022-01-22 DIAGNOSIS — R109 Unspecified abdominal pain: Secondary | ICD-10-CM | POA: Diagnosis not present

## 2022-01-22 DIAGNOSIS — N281 Cyst of kidney, acquired: Secondary | ICD-10-CM | POA: Diagnosis not present

## 2022-01-22 DIAGNOSIS — R1030 Lower abdominal pain, unspecified: Secondary | ICD-10-CM | POA: Diagnosis not present

## 2022-02-03 ENCOUNTER — Other Ambulatory Visit: Payer: Self-pay | Admitting: Internal Medicine

## 2022-02-03 DIAGNOSIS — N5082 Scrotal pain: Secondary | ICD-10-CM

## 2022-02-05 DIAGNOSIS — M25552 Pain in left hip: Secondary | ICD-10-CM | POA: Diagnosis not present

## 2022-02-11 ENCOUNTER — Ambulatory Visit
Admission: RE | Admit: 2022-02-11 | Discharge: 2022-02-11 | Disposition: A | Discharge status: Home / Self Care | Payer: Medicare Other | Source: Ambulatory Visit | Attending: Internal Medicine | Admitting: Internal Medicine

## 2022-02-11 DIAGNOSIS — N5082 Scrotal pain: Secondary | ICD-10-CM

## 2022-02-19 DIAGNOSIS — N50811 Right testicular pain: Secondary | ICD-10-CM | POA: Diagnosis not present

## 2022-02-19 DIAGNOSIS — N5201 Erectile dysfunction due to arterial insufficiency: Secondary | ICD-10-CM | POA: Diagnosis not present

## 2022-02-19 DIAGNOSIS — N486 Induration penis plastica: Secondary | ICD-10-CM | POA: Diagnosis not present

## 2022-06-18 DIAGNOSIS — F419 Anxiety disorder, unspecified: Secondary | ICD-10-CM | POA: Diagnosis not present

## 2022-06-18 DIAGNOSIS — G4700 Insomnia, unspecified: Secondary | ICD-10-CM | POA: Diagnosis not present

## 2022-06-18 DIAGNOSIS — R5383 Other fatigue: Secondary | ICD-10-CM | POA: Diagnosis not present

## 2022-06-18 DIAGNOSIS — R6889 Other general symptoms and signs: Secondary | ICD-10-CM | POA: Diagnosis not present

## 2022-06-18 DIAGNOSIS — E039 Hypothyroidism, unspecified: Secondary | ICD-10-CM | POA: Diagnosis not present

## 2022-07-31 DIAGNOSIS — Z23 Encounter for immunization: Secondary | ICD-10-CM | POA: Diagnosis not present

## 2022-07-31 DIAGNOSIS — L0292 Furuncle, unspecified: Secondary | ICD-10-CM | POA: Diagnosis not present

## 2022-08-04 DIAGNOSIS — L02212 Cutaneous abscess of back [any part, except buttock]: Secondary | ICD-10-CM | POA: Diagnosis not present

## 2022-08-04 DIAGNOSIS — L720 Epidermal cyst: Secondary | ICD-10-CM | POA: Diagnosis not present

## 2022-08-24 IMAGING — US US SCROTUM W/ DOPPLER COMPLETE
1 series · 14 of 25 positions shown · non-contrast
Comparison: None.

CLINICAL DATA: Acute pain in scrotum

EXAM:
SCROTAL ULTRASOUND
DOPPLER ULTRASOUND OF THE TESTICLES
TECHNIQUE: Complete ultrasound examination of the testicles, epididymis, and
other scrotal structures was performed. Color and spectral Doppler
ultrasound were also utilized to evaluate blood flow to the
testicles.

[Series 1: us scrotum w/ doppler complete · 0.05mm/px · 14 of 52 slices shown]
[im 1/52]
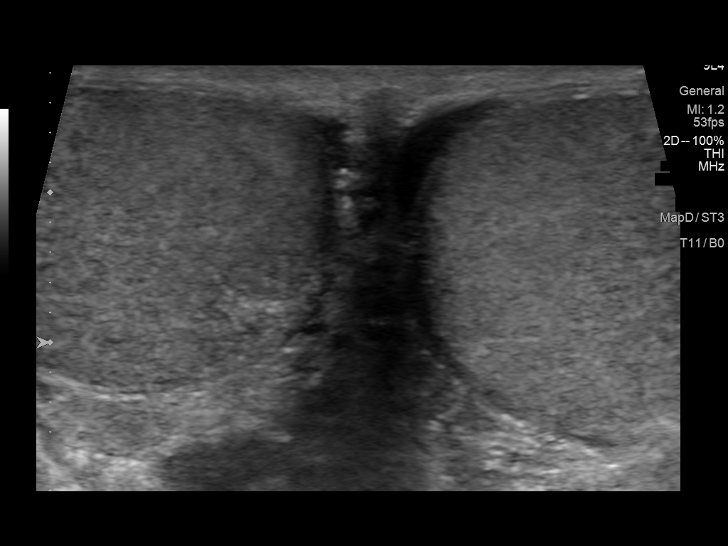
[im 5/52]
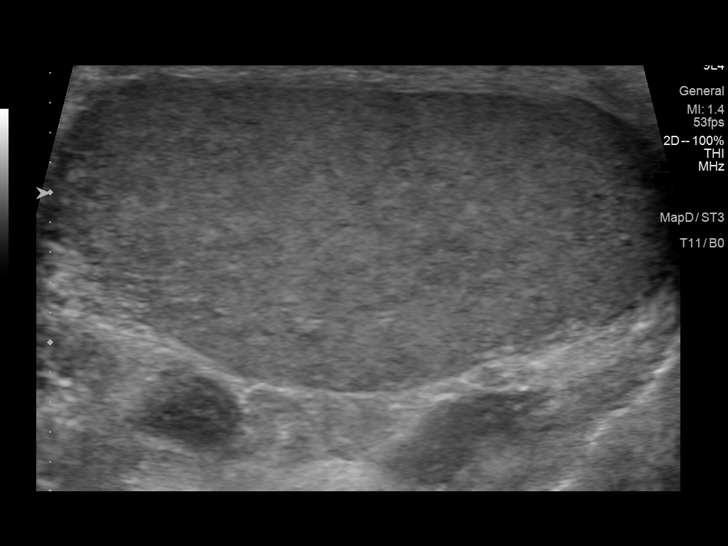
[im 9/52]
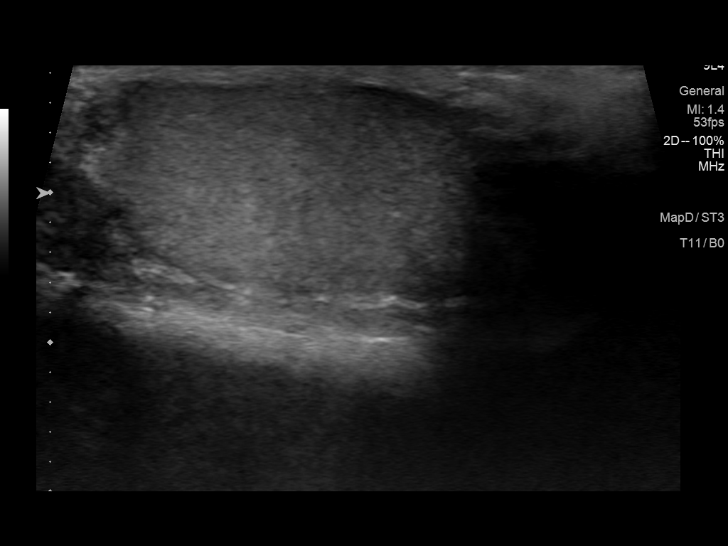
[im 13/52]
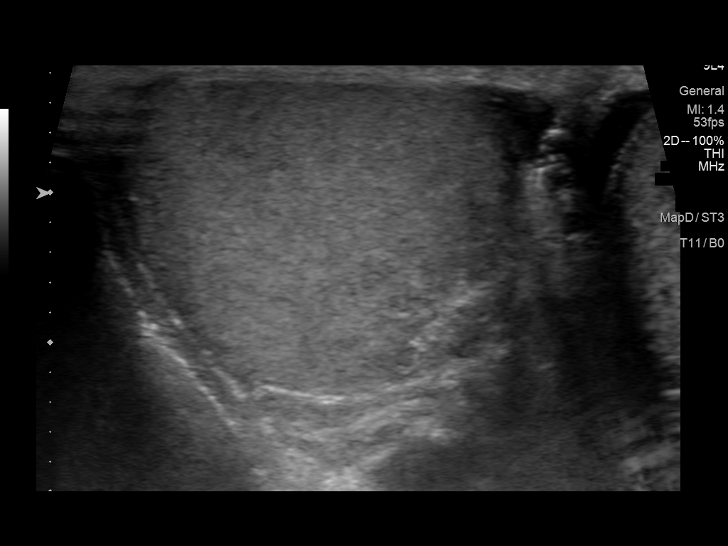
[im 18/52]
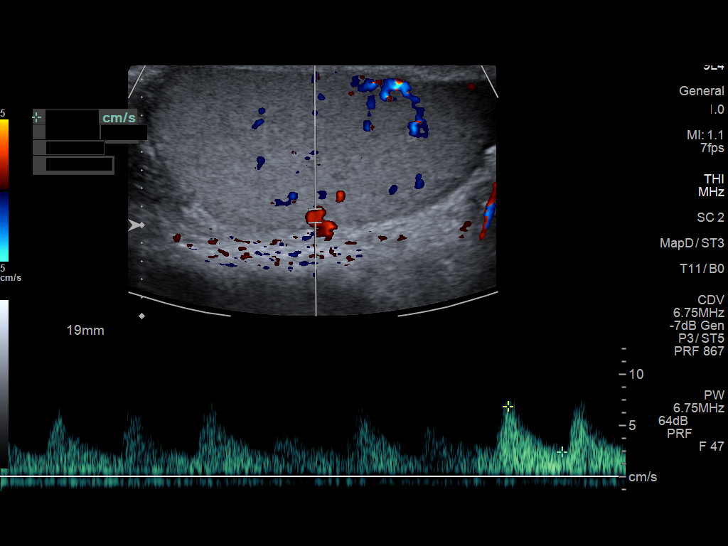
[im 20/52]
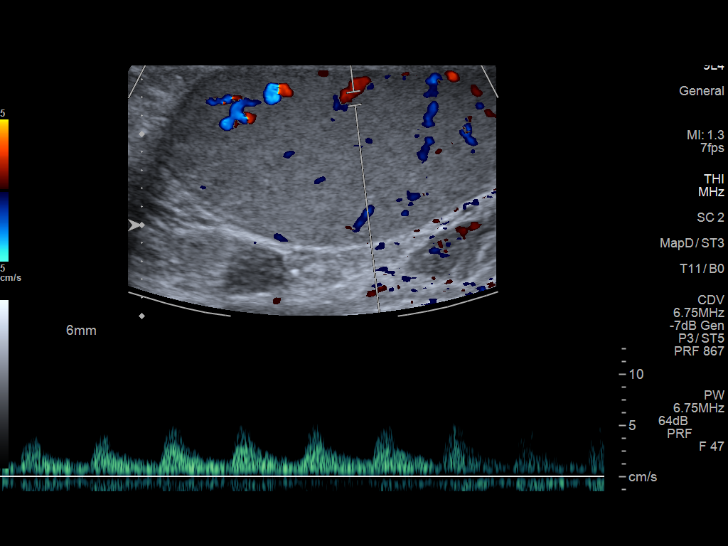
[im 24/52]
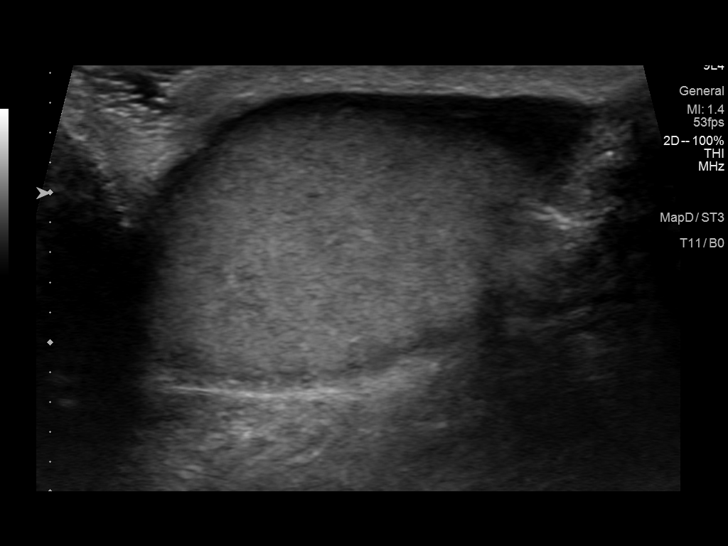
[im 28/52]
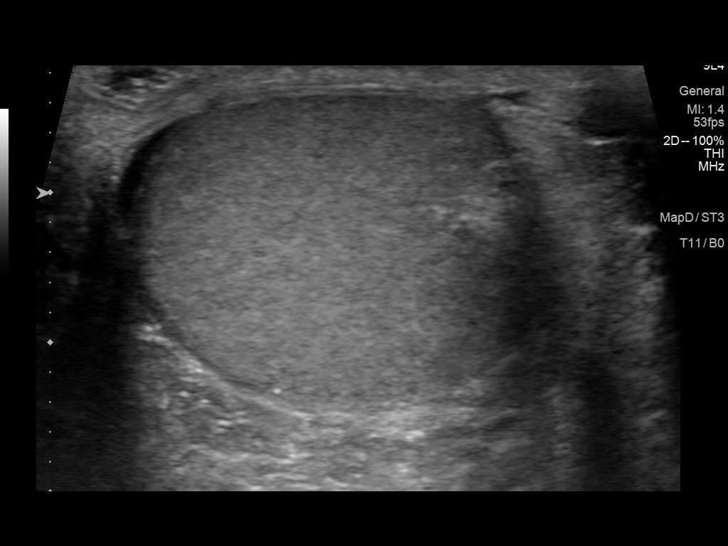
[im 32/52]
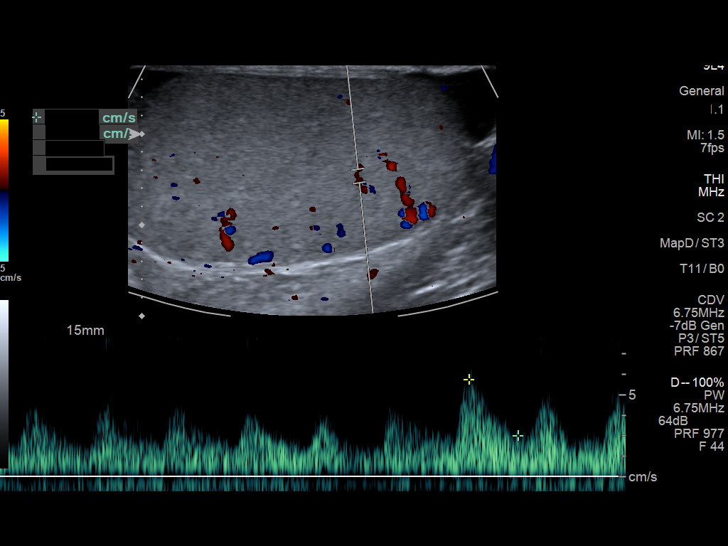
[im 35/52]
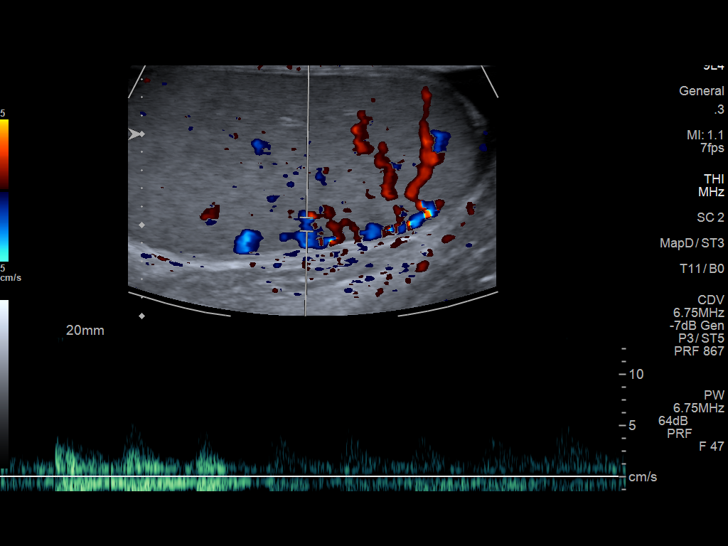
[im 39/52]
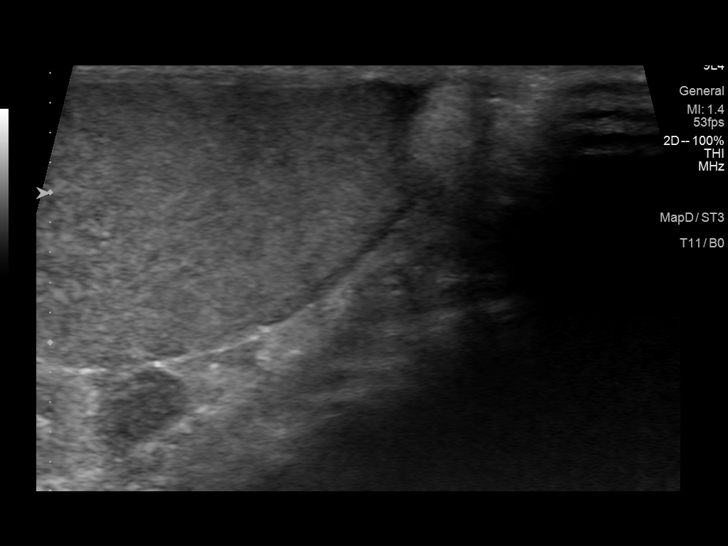
[im 43/52]
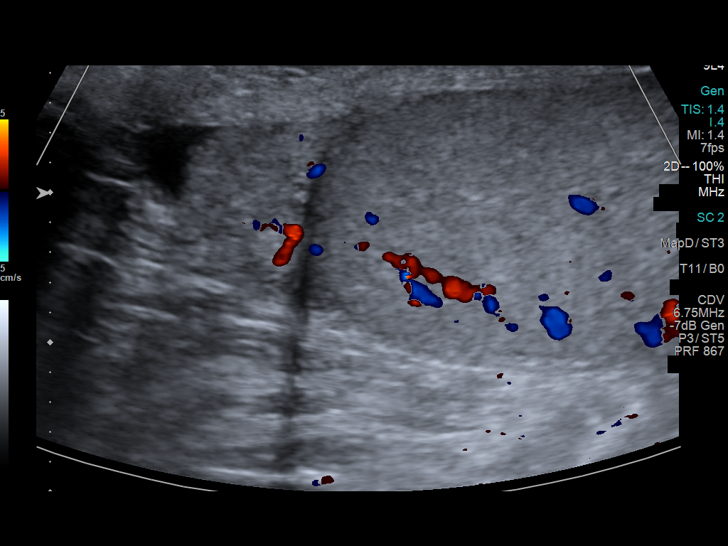
[im 47/52]
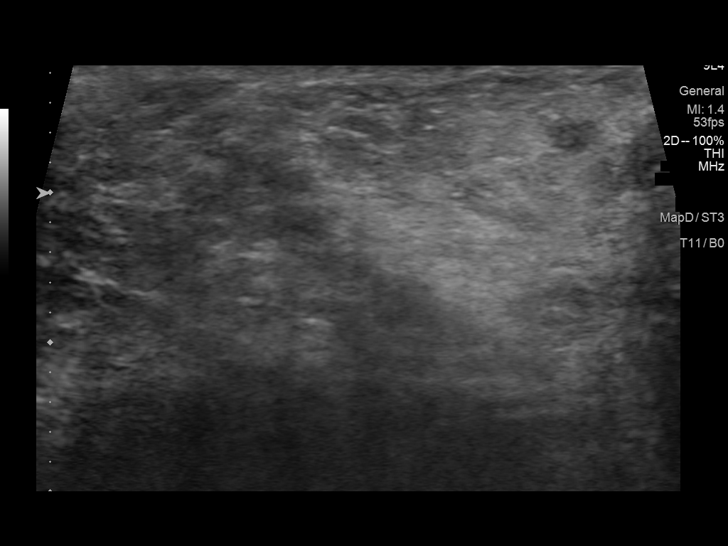
[im 52/52]
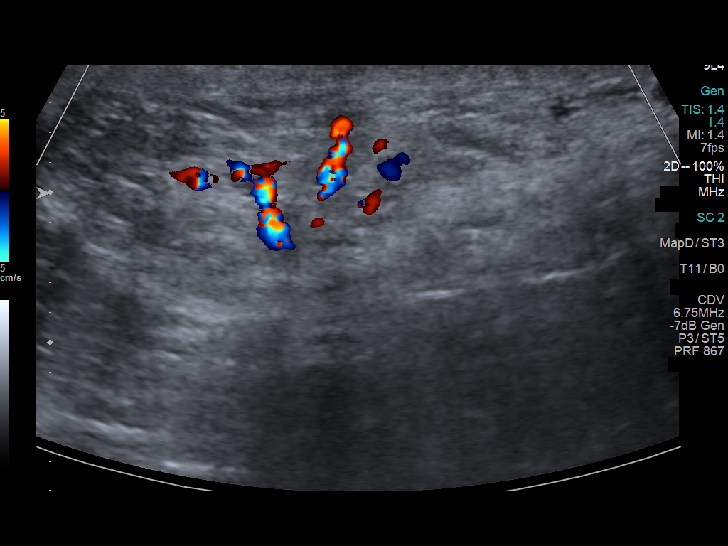

[14 of 25 positions shown; findings below may reference images not displayed]

FINDINGS: Right testicle

Measurements: 4.1 x 2.0 x 2.5 cm. No mass or microlithiasis
visualized.

Left testicle

Measurements: 4.2 x 2.1 x 2.7 cm. No mass or microlithiasis
visualized.

Right epididymis:  Normal in size and appearance.

Left epididymis:  Normal in size and appearance.

Hydrocele:  None visualized.

Varicocele:  None visualized.

Pulsed Doppler interrogation of both testes demonstrates normal low
resistance arterial and venous waveforms bilaterally.
IMPRESSION: Normal scrotal ultrasound. No evidence of torsion or
epididymo-orchitis.

## 2022-09-10 DIAGNOSIS — E039 Hypothyroidism, unspecified: Secondary | ICD-10-CM | POA: Diagnosis not present

## 2022-09-10 DIAGNOSIS — E785 Hyperlipidemia, unspecified: Secondary | ICD-10-CM | POA: Diagnosis not present

## 2022-09-10 DIAGNOSIS — R972 Elevated prostate specific antigen [PSA]: Secondary | ICD-10-CM | POA: Diagnosis not present

## 2022-09-15 DIAGNOSIS — G4700 Insomnia, unspecified: Secondary | ICD-10-CM | POA: Diagnosis not present

## 2022-09-15 DIAGNOSIS — N5082 Scrotal pain: Secondary | ICD-10-CM | POA: Diagnosis not present

## 2022-09-15 DIAGNOSIS — Z0000 Encounter for general adult medical examination without abnormal findings: Secondary | ICD-10-CM | POA: Diagnosis not present

## 2022-09-15 DIAGNOSIS — H9190 Unspecified hearing loss, unspecified ear: Secondary | ICD-10-CM | POA: Diagnosis not present

## 2022-09-15 DIAGNOSIS — R972 Elevated prostate specific antigen [PSA]: Secondary | ICD-10-CM | POA: Diagnosis not present

## 2022-09-15 DIAGNOSIS — Z1331 Encounter for screening for depression: Secondary | ICD-10-CM | POA: Diagnosis not present

## 2022-09-15 DIAGNOSIS — R059 Cough, unspecified: Secondary | ICD-10-CM | POA: Diagnosis not present

## 2022-09-15 DIAGNOSIS — M6740 Ganglion, unspecified site: Secondary | ICD-10-CM | POA: Diagnosis not present

## 2022-09-15 DIAGNOSIS — M25519 Pain in unspecified shoulder: Secondary | ICD-10-CM | POA: Diagnosis not present

## 2022-09-15 DIAGNOSIS — R1030 Lower abdominal pain, unspecified: Secondary | ICD-10-CM | POA: Diagnosis not present

## 2022-09-15 DIAGNOSIS — Z1339 Encounter for screening examination for other mental health and behavioral disorders: Secondary | ICD-10-CM | POA: Diagnosis not present

## 2022-09-15 DIAGNOSIS — E039 Hypothyroidism, unspecified: Secondary | ICD-10-CM | POA: Diagnosis not present

## 2022-09-22 DIAGNOSIS — H906 Mixed conductive and sensorineural hearing loss, bilateral: Secondary | ICD-10-CM | POA: Diagnosis not present

## 2022-10-07 DIAGNOSIS — M25511 Pain in right shoulder: Secondary | ICD-10-CM | POA: Diagnosis not present

## 2022-10-23 DIAGNOSIS — M19032 Primary osteoarthritis, left wrist: Secondary | ICD-10-CM | POA: Diagnosis not present

## 2022-10-27 DIAGNOSIS — M542 Cervicalgia: Secondary | ICD-10-CM | POA: Diagnosis not present

## 2022-11-12 DIAGNOSIS — M546 Pain in thoracic spine: Secondary | ICD-10-CM | POA: Diagnosis not present

## 2022-11-12 DIAGNOSIS — M542 Cervicalgia: Secondary | ICD-10-CM | POA: Diagnosis not present

## 2022-11-17 DIAGNOSIS — H2513 Age-related nuclear cataract, bilateral: Secondary | ICD-10-CM | POA: Diagnosis not present

## 2022-11-18 DIAGNOSIS — H838X3 Other specified diseases of inner ear, bilateral: Secondary | ICD-10-CM | POA: Diagnosis not present

## 2022-11-18 DIAGNOSIS — H6693 Otitis media, unspecified, bilateral: Secondary | ICD-10-CM | POA: Diagnosis not present

## 2022-11-18 DIAGNOSIS — H900 Conductive hearing loss, bilateral: Secondary | ICD-10-CM | POA: Diagnosis not present

## 2023-01-20 DIAGNOSIS — J989 Respiratory disorder, unspecified: Secondary | ICD-10-CM | POA: Diagnosis not present

## 2023-01-20 DIAGNOSIS — Z8616 Personal history of COVID-19: Secondary | ICD-10-CM | POA: Diagnosis not present

## 2023-01-20 DIAGNOSIS — R053 Chronic cough: Secondary | ICD-10-CM | POA: Diagnosis not present

## 2023-02-03 DIAGNOSIS — H6693 Otitis media, unspecified, bilateral: Secondary | ICD-10-CM | POA: Diagnosis not present

## 2023-02-23 DIAGNOSIS — H6691 Otitis media, unspecified, right ear: Secondary | ICD-10-CM | POA: Diagnosis not present

## 2023-02-23 DIAGNOSIS — H5509 Other forms of nystagmus: Secondary | ICD-10-CM | POA: Diagnosis not present

## 2023-02-23 DIAGNOSIS — H6693 Otitis media, unspecified, bilateral: Secondary | ICD-10-CM | POA: Diagnosis not present

## 2023-02-25 ENCOUNTER — Institutional Professional Consult (permissible substitution): Payer: Medicare Other | Admitting: Internal Medicine

## 2023-03-03 DIAGNOSIS — H6693 Otitis media, unspecified, bilateral: Secondary | ICD-10-CM | POA: Diagnosis not present

## 2023-03-05 ENCOUNTER — Encounter: Payer: Self-pay | Admitting: Pulmonary Disease

## 2023-03-05 ENCOUNTER — Ambulatory Visit (INDEPENDENT_AMBULATORY_CARE_PROVIDER_SITE_OTHER): Payer: Medicare Other | Admitting: Pulmonary Disease

## 2023-03-05 VITALS — BP 118/64 | HR 62 | Ht 67.0 in | Wt 155.6 lb

## 2023-03-05 DIAGNOSIS — R053 Chronic cough: Secondary | ICD-10-CM

## 2023-03-05 MED ORDER — PREDNISONE 20 MG PO TABS: ORAL_TABLET | ORAL | 0 refills | Status: AC

## 2023-03-05 MED ORDER — BREZTRI AEROSPHERE 160-9-4.8 MCG/ACT IN AERO: 2.0000 | INHALATION_SPRAY | Freq: Two times a day (BID) | RESPIRATORY_TRACT | 0 refills | Status: DC

## 2023-03-05 NOTE — Progress Notes (Signed)
@Patient  ID: Andrew Barnes, male    DOB: [birth year], 67 y.o.   MRN: [id number]  Chief Complaint  Patient presents with   67 y.o. man whom we are for chronic cough since November 2023. Had COVID in September 2023. Has not been able to cough up any phlegm. Denies any increased SOB or wheezing.     Referring provider: Alysia Penna, MD  HPI:   67 y.o. man whom we are seeing for evaluation of chronic cough.  Most recent PCP note reviewed.  Patient was usual state of health.  Contracted COVID 06/2022.  Initially improved symptom wise.  Then developed cough starting 09/2022.  Initially productive.  Now mostly dry.  No increased shortness of breath, wheezing etc.  Maybe sometimes worse mornings and evenings.  But present throughout the day.  No other relieving or exacerbating factors.  No seasonal environmental factors he can identify that make things better or worse.  No position make things better or worse.  He reports chest x-ray, I see this via PCP.  I cannot see images.  But reportedly normal.  He was given recent prednisone course 20 mg daily.  No significant improvement per his report.  Not using maintenance inhalers.  Past medical history: Hypothyroid Surgical history: Lithotripsy, knee surgery, wrist surgery Family history:History reviewed. No pertinent family history. Social history: Never smoker, lives in [place] / Pulmonary Flowsheets:   ACT:      No data to display          MMRC:     No data to display          Epworth:      No data to display          Tests:   FENO:  No results found for: "NITRICOXIDE"  PFT:     No data to display          WALK:      No data to display          Imaging: Personally reviewed and as per EMR No results found.  Lab Results: Personally reviewed and as per EMR CBC    Component Value Date/Time   WBC 15.7 (H) 08/17/2018 0529   RBC 3.99 (L) 08/17/2018 0529   HGB 12.8 (L)  08/17/2018 0529   HCT 38.1 (L) 08/17/2018 0529   PLT 252 08/17/2018 0529   MCV 95.5 08/17/2018 0529   MCH 32.1 08/17/2018 0529   MCHC 33.6 08/17/2018 0529   RDW 12.3 08/17/2018 0529    BMET    Component Value Date/Time   NA 141 08/17/2018 0529   K 4.7 08/17/2018 0529   CL 105 08/17/2018 0529   CO2 26 08/17/2018 0529   GLUCOSE 160 (H) 08/17/2018 0529   BUN 15 08/17/2018 0529   CREATININE 1.13 08/17/2018 0529   CALCIUM 9.5 08/17/2018 0529   GFRNONAA >60 08/17/2018 0529   GFRAA >60 08/17/2018 0529    BNP No results found for: "BNP"  ProBNP No results found for: "PROBNP"  Specialty Problems   None   Allergies  Allergen Reactions   Erythromycin Nausea And Vomiting   Sulfa Antibiotics     Red splotches     There is no immunization history on file for this patient.  Past Medical History:  Diagnosis Date   Arthritis    oa   History of kidney stones    Hyperlipidemia     Tobacco History: Social History   Tobacco Use  Smoking Status Never   Passive exposure: Past  Smokeless Tobacco Never   Counseling given: Not Answered   Continue to not smoke  Outpatient Encounter Medications as of 03/05/2023  Medication Sig   aspirin EC 81 MG tablet Take 81 mg by mouth once.   levothyroxine (SYNTHROID, LEVOTHROID) 50 MCG tablet Take 50 mcg by mouth daily.   Multiple Vitamin (MULTIVITAMIN WITH MINERALS) TABS tablet Take 1 tablet by mouth every evening.   pravastatin (PRAVACHOL) 40 MG tablet Take 40 mg by mouth at bedtime.    predniSONE (DELTASONE) 20 MG tablet Take 2 tablets (40 mg total) by mouth daily with breakfast for 5 days, THEN 1 tablet (20 mg total) daily with breakfast for 5 days.   traZODone (DESYREL) 100 MG tablet Take 100 mg by mouth at bedtime as needed.   [DISCONTINUED] acetaminophen (TYLENOL) 500 MG tablet Take 1,000 mg by mouth daily as needed for moderate pain or headache.   [DISCONTINUED] aspirin EC 325 MG EC tablet Take 1 tablet (325 mg total) by  mouth 2 (two) times daily. Take for three weeks following surgery. Then resume one 81 mg aspirin once a day.   [DISCONTINUED] diphenhydrAMINE (BENADRYL) 25 MG tablet Take 25 mg by mouth at bedtime.   [DISCONTINUED] gabapentin (NEURONTIN) 300 MG capsule Take 1 capsule (300 mg total) by mouth 3 (three) times daily. Gabapentin 300 mg Protocol Take a 300 mg capsule three times a day for two weeks following surgery. Then take a 300 mg capsule two times a day for two weeks.  Then take a 300 mg capsule once a day for two weeks.  Then discontinue the Gabapentin.   [DISCONTINUED] methocarbamol (ROBAXIN) 500 MG tablet Take 1 tablet (500 mg total) by mouth every 6 (six) hours as needed for muscle spasms.   [DISCONTINUED] Omega-3 Fatty Acids (FISH OIL) 1000 MG CAPS Take 1,000 mg by mouth daily.   [DISCONTINUED] oxyCODONE (OXY IR/ROXICODONE) 5 MG immediate release tablet Take 1-2 tablets (5-10 mg total) by mouth every 6 (six) hours as needed for moderate pain (pain score 4-6).   [DISCONTINUED] traMADol (ULTRAM) 50 MG tablet Take 1-2 tablets (50-100 mg total) by mouth every 6 (six) hours as needed for moderate pain (refractory to oxycodone).   No facility-administered encounter medications on file as of 03/05/2023.     Review of Systems  Review of Systems  No chest pain with exertion.  No orthopnea or PND.  Comprehensive review of systems otherwise negative. Physical Exam  BP 118/64   Pulse 62   Ht 5\' 7"  (1.702 m)   Wt 155 lb 9.6 oz (70.6 kg)   SpO2 99% Comment: on RA  BMI 24.37 kg/m   Wt Readings from Last 5 Encounters:  03/05/23 155 lb 9.6 oz (70.6 kg)  08/16/18 174 lb 2.6 oz (79 kg)  08/11/18 175 lb (79.4 kg)    BMI Readings from Last 5 Encounters:  03/05/23 24.37 kg/m  08/16/18 28.11 kg/m  08/11/18 28.25 kg/m     Physical Exam General sitting in chair in no acute distress Eyes: EOMI, no icterus Neck: Supple, no JVP Abdomen: Nondistended, bowel sounds present MSK: No synovitis,  no joint effusion Neuro: Normal gait, no weakness Psych: Normal mood, full affect Pulmonary: Clear, normal work of breathing Cardiovascular: Warm no edema   Assessment & Plan:   Chronic cough: Few weeks after COVID infection in the fall 2023.  Primarily dry.  Slowly improving with time.  But fairly significant.  No worsening of the  improvement today with albuterol or prednisone 20 mg a day.  Given triggered by what sounds like viral infection, cough variant asthma felt most likely.  No signs or symptoms of postnasal drip.  Denies reflux symptoms.  Trial prednisone 40 mg for 5 days and 20 mg for 5 days then stop.  Breztri 2 puffs twice a day, samples provided.  Can prescribe if beneficial.  Chest imaging, chest x-ray, reportedly clear.  Consider cross-sectional imaging in the coming weeks if no improvement despite additional prednisone and maximum inhaler therapy.   Return in about 6 weeks (around 04/16/2023).   Karren Burly, MD 03/05/2023

## 2023-03-05 NOTE — Patient Instructions (Addendum)
Nice to meet you  With a reportedly clear chest x-ray and symptoms developing after a viral infection, I am most suspicious of a like a cough variant of asthma.  Use Breztri 2 puffs in the morning and 2 puffs in the evening, rinse your mouth out with water after every use.  This inhalers is not a decrease inflammation in the air tubes and relax the smooth muscle of the air tubes in an effort to improve cough.  Each inhaler will last for 14 days, be mindful of the dose counter on the inhaler.  Take prednisone 40 mg for 5 days then 20 mg for 5 days then stop.  Return to clinic in 6 weeks or sooner as needed with Dr. Judeth Horn

## 2023-04-13 DIAGNOSIS — N486 Induration penis plastica: Secondary | ICD-10-CM | POA: Diagnosis not present

## 2023-04-13 DIAGNOSIS — N401 Enlarged prostate with lower urinary tract symptoms: Secondary | ICD-10-CM | POA: Diagnosis not present

## 2023-04-13 DIAGNOSIS — R351 Nocturia: Secondary | ICD-10-CM | POA: Diagnosis not present

## 2023-04-16 ENCOUNTER — Ambulatory Visit (INDEPENDENT_AMBULATORY_CARE_PROVIDER_SITE_OTHER): Payer: Medicare Other | Admitting: Pulmonary Disease

## 2023-04-16 ENCOUNTER — Encounter: Payer: Self-pay | Admitting: Pulmonary Disease

## 2023-04-16 VITALS — BP 140/82 | HR 68 | Temp 98.0°F | Ht 67.0 in | Wt 157.0 lb

## 2023-04-16 DIAGNOSIS — J45991 Cough variant asthma: Secondary | ICD-10-CM | POA: Diagnosis not present

## 2023-04-16 MED ORDER — BREZTRI AEROSPHERE 160-9-4.8 MCG/ACT IN AERO: 2.0000 | INHALATION_SPRAY | Freq: Two times a day (BID) | RESPIRATORY_TRACT | 11 refills | Status: DC

## 2023-04-16 MED ORDER — BREZTRI AEROSPHERE 160-9-4.8 MCG/ACT IN AERO: 2.0000 | INHALATION_SPRAY | Freq: Two times a day (BID) | RESPIRATORY_TRACT | 0 refills | Status: DC

## 2023-04-16 NOTE — Addendum Note (Signed)
Addended byClyda Greener M on: 04/16/2023 09:34 AM   Modules accepted: Orders

## 2023-04-16 NOTE — Patient Instructions (Signed)
I am glad your cough is improved  I think the Markus Daft has helped  I provided samples today but also sent a new prescription for this medication  Please let me know if too expensive  Return to clinic in 1 year or sooner as needed with Dr. Judeth Horn

## 2023-04-16 NOTE — Progress Notes (Signed)
@Patient  ID: Andrew Barnes, male    DOB: [birth year], 67 y.o.   MRN: [id number]  Chief Complaint  Patient presents with   Follow-up    Much improved.  Breztri helping with cough.    Referring provider: Alysia Penna, MD  HPI:   67 y.o. man whom we are seeing for evaluation of chronic cough.    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  Prednisone has helped in the past.  Prescribed Breztri via samples.  Cough is essentially gone.  Not adherent to twice daily dosing.  Regardless current regimen working well.  He is very pleased with this.   HPI at initial visit: Patient was usual state of health.  Contracted COVID 06/2022.  Initially improved symptom wise.  Then developed cough starting 09/2022.  Initially productive.  Now mostly dry.  No increased shortness of breath, wheezing etc.  Maybe sometimes worse mornings and evenings.  But present throughout the day.  No other relieving or exacerbating factors.  No seasonal environmental factors he can identify that make things better or worse.  No position make things better or worse.  He reports chest x-ray, I see this via PCP.  I cannot see images.  But reportedly normal.  He was given recent prednisone course 20 mg daily.  No significant improvement per his report.  Not using maintenance inhalers.  Past medical history: Hypothyroid Surgical history: Lithotripsy, knee surgery, wrist surgery Family history:History reviewed. No pertinent family history. Social history: Never smoker, lives in [place] / Pulmonary Flowsheets:   ACT:      No data to display           MMRC:     No data to display           Epworth:      No data to display           Tests:   FENO:  No results found for: "NITRICOXIDE"  PFT:     No data to display           WALK:      No data to display           Imaging: Personally reviewed and as per EMR No results found.  Lab  Results: Personally reviewed and as per EMR CBC    Component Value Date/Time   WBC 15.7 (H) 08/17/2018 0529   RBC 3.99 (L) 08/17/2018 0529   HGB 12.8 (L) 08/17/2018 0529   HCT 38.1 (L) 08/17/2018 0529   PLT 252 08/17/2018 0529   MCV 95.5 08/17/2018 0529   MCH 32.1 08/17/2018 0529   MCHC 33.6 08/17/2018 0529   RDW 12.3 08/17/2018 0529    BMET    Component Value Date/Time   NA 141 08/17/2018 0529   K 4.7 08/17/2018 0529   CL 105 08/17/2018 0529   CO2 26 08/17/2018 0529   GLUCOSE 160 (H) 08/17/2018 0529   BUN 15 08/17/2018 0529   CREATININE 1.13 08/17/2018 0529   CALCIUM 9.5 08/17/2018 0529   GFRNONAA >60 08/17/2018 0529   GFRAA >60 08/17/2018 0529    BNP No results found for: "BNP"  ProBNP No results found for: "PROBNP"  Specialty Problems   None   Allergies  Allergen Reactions   Azithromycin Nausea And Vomiting and Nausea Only   Erythromycin Nausea And Vomiting   Sulfa Antibiotics     Red splotches     There is no immunization history on file for this  patient.  Past Medical History:  Diagnosis Date   Arthritis    oa   History of kidney stones    Hyperlipidemia     Tobacco History: Social History   Tobacco Use  Smoking Status Never   Passive exposure: Past  Smokeless Tobacco Never   Counseling given: Not Answered   Continue to not smoke  Outpatient Encounter Medications as of 04/16/2023  Medication Sig   aspirin EC 81 MG tablet Take 81 mg by mouth once.   Budeson-Glycopyrrol-Formoterol (BREZTRI AEROSPHERE) 160-9-4.8 MCG/ACT AERO Inhale 2 puffs into the lungs in the morning and at bedtime.   levothyroxine (SYNTHROID, LEVOTHROID) 50 MCG tablet Take 50 mcg by mouth daily.   Multiple Vitamin (MULTIVITAMIN WITH MINERALS) TABS tablet Take 1 tablet by mouth every evening.   pravastatin (PRAVACHOL) 40 MG tablet Take 40 mg by mouth at bedtime.    traZODone (DESYREL) 100 MG tablet Take 100 mg by mouth at bedtime as needed.   [DISCONTINUED]  Budeson-Glycopyrrol-Formoterol (BREZTRI AEROSPHERE) 160-9-4.8 MCG/ACT AERO Inhale 2 puffs into the lungs in the morning and at bedtime.   No facility-administered encounter medications on file as of 04/16/2023.     Review of Systems  Review of Systems  N/a Physical Exam  BP (!) 140/82 (BP Location: Left Arm, Patient Position: Sitting, Cuff Size: Normal)   Pulse 68   Temp 98 F (36.7 C) (Oral)   Ht 5\' 7"  (1.702 m)   Wt 157 lb (71.2 kg)   SpO2 97%   BMI 24.59 kg/m   Wt Readings from Last 5 Encounters:  04/16/23 157 lb (71.2 kg)  03/05/23 155 lb 9.6 oz (70.6 kg)  08/16/18 174 lb 2.6 oz (79 kg)  08/11/18 175 lb (79.4 kg)    BMI Readings from Last 5 Encounters:  04/16/23 24.59 kg/m  03/05/23 24.37 kg/m  08/16/18 28.11 kg/m  08/11/18 28.25 kg/m     Physical Exam General sitting in chair in no acute distress Eyes: EOMI, no icterus Neck: Supple, no JVP Abdomen: Nondistended, bowel sounds present MSK: No synovitis, no joint effusion Neuro: Normal gait, no weakness Psych: Normal mood, full affect Pulmonary: Clear, normal work of breathing Cardiovascular: Warm no edema   Assessment & Plan:   Chronic cough: Few weeks after COVID infection in the fall 2023.  Primarily dry.  Slowly improving with time.  But fairly significant. Given triggered by what sounds like viral infection, cough variant asthma felt most likely.  No signs or symptoms of postnasal drip.  Denies reflux symptoms.  Chest imaging clear. Improved with prednisone and Breztri. Reminded him of BID dosing, new prescription for [facility] today.  Cough variant asthma: Following viral illness. Improved with breztri as above.   Return in about 1 year (around 04/15/2024) for f/u Dr. Judeth Horn.   Karren Burly, MD 04/16/2023

## 2023-05-04 DIAGNOSIS — M7918 Myalgia, other site: Secondary | ICD-10-CM | POA: Diagnosis not present

## 2023-05-04 DIAGNOSIS — M542 Cervicalgia: Secondary | ICD-10-CM | POA: Diagnosis not present

## 2023-05-05 DIAGNOSIS — M19021 Primary osteoarthritis, right elbow: Secondary | ICD-10-CM | POA: Diagnosis not present

## 2023-05-11 DIAGNOSIS — M25552 Pain in left hip: Secondary | ICD-10-CM | POA: Diagnosis not present

## 2023-07-14 DIAGNOSIS — H838X3 Other specified diseases of inner ear, bilateral: Secondary | ICD-10-CM | POA: Diagnosis not present

## 2023-07-14 DIAGNOSIS — H6693 Otitis media, unspecified, bilateral: Secondary | ICD-10-CM | POA: Diagnosis not present

## 2023-07-14 DIAGNOSIS — H906 Mixed conductive and sensorineural hearing loss, bilateral: Secondary | ICD-10-CM | POA: Diagnosis not present

## 2023-08-27 DIAGNOSIS — Z23 Encounter for immunization: Secondary | ICD-10-CM | POA: Diagnosis not present

## 2023-09-01 DIAGNOSIS — H9193 Unspecified hearing loss, bilateral: Secondary | ICD-10-CM | POA: Diagnosis not present

## 2023-09-01 DIAGNOSIS — M25511 Pain in right shoulder: Secondary | ICD-10-CM | POA: Diagnosis not present

## 2023-09-01 DIAGNOSIS — H9203 Otalgia, bilateral: Secondary | ICD-10-CM | POA: Diagnosis not present

## 2023-09-01 DIAGNOSIS — M25512 Pain in left shoulder: Secondary | ICD-10-CM | POA: Diagnosis not present

## 2023-09-02 DIAGNOSIS — H838X3 Other specified diseases of inner ear, bilateral: Secondary | ICD-10-CM | POA: Diagnosis not present

## 2023-09-02 DIAGNOSIS — H906 Mixed conductive and sensorineural hearing loss, bilateral: Secondary | ICD-10-CM | POA: Diagnosis not present

## 2023-09-02 DIAGNOSIS — H6593 Unspecified nonsuppurative otitis media, bilateral: Secondary | ICD-10-CM | POA: Diagnosis not present

## 2023-09-16 DIAGNOSIS — R972 Elevated prostate specific antigen [PSA]: Secondary | ICD-10-CM | POA: Diagnosis not present

## 2023-09-16 DIAGNOSIS — E039 Hypothyroidism, unspecified: Secondary | ICD-10-CM | POA: Diagnosis not present

## 2023-09-16 DIAGNOSIS — Z79899 Other long term (current) drug therapy: Secondary | ICD-10-CM | POA: Diagnosis not present

## 2023-10-05 DIAGNOSIS — Z1331 Encounter for screening for depression: Secondary | ICD-10-CM | POA: Diagnosis not present

## 2023-10-05 DIAGNOSIS — R059 Cough, unspecified: Secondary | ICD-10-CM | POA: Diagnosis not present

## 2023-10-05 DIAGNOSIS — Z0000 Encounter for general adult medical examination without abnormal findings: Secondary | ICD-10-CM | POA: Diagnosis not present

## 2023-10-05 DIAGNOSIS — Z23 Encounter for immunization: Secondary | ICD-10-CM | POA: Diagnosis not present

## 2023-10-05 DIAGNOSIS — F419 Anxiety disorder, unspecified: Secondary | ICD-10-CM | POA: Diagnosis not present

## 2023-10-05 DIAGNOSIS — G4700 Insomnia, unspecified: Secondary | ICD-10-CM | POA: Diagnosis not present

## 2023-10-05 DIAGNOSIS — E039 Hypothyroidism, unspecified: Secondary | ICD-10-CM | POA: Diagnosis not present

## 2023-10-05 DIAGNOSIS — M25519 Pain in unspecified shoulder: Secondary | ICD-10-CM | POA: Diagnosis not present

## 2023-10-05 DIAGNOSIS — Z1339 Encounter for screening examination for other mental health and behavioral disorders: Secondary | ICD-10-CM | POA: Diagnosis not present

## 2023-10-05 DIAGNOSIS — E785 Hyperlipidemia, unspecified: Secondary | ICD-10-CM | POA: Diagnosis not present

## 2023-12-02 ENCOUNTER — Other Ambulatory Visit: Payer: Self-pay | Admitting: Orthopaedic Surgery

## 2023-12-02 DIAGNOSIS — Z01818 Encounter for other preprocedural examination: Secondary | ICD-10-CM

## 2023-12-02 DIAGNOSIS — G8929 Other chronic pain: Secondary | ICD-10-CM

## 2023-12-14 ENCOUNTER — Telehealth: Payer: Self-pay | Admitting: Pulmonary Disease

## 2023-12-14 NOTE — Telephone Encounter (Signed)
 Fax received from Dr. 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  Patient needs surgery clearance. Surgery is 04/14/24. Patient was seen on 04/16/23. Office protocol is a risk assessment can be sent to surgeon if patient has been seen in 60 days or less.   I called and scheduled the pt for appt with Dr. Judeth Horn for 02/15/24 for risk assessment  Routing to clearance pool until this is complete

## 2023-12-16 DIAGNOSIS — E039 Hypothyroidism, unspecified: Secondary | ICD-10-CM | POA: Diagnosis not present

## 2023-12-30 ENCOUNTER — Ambulatory Visit
Admission: RE | Admit: 2023-12-30 | Discharge: 2023-12-30 | Disposition: A | Discharge status: Home / Self Care | Payer: Medicare Other | Source: Ambulatory Visit | Attending: Orthopaedic Surgery | Admitting: Orthopaedic Surgery

## 2023-12-30 DIAGNOSIS — M75121 Complete rotator cuff tear or rupture of right shoulder, not specified as traumatic: Secondary | ICD-10-CM | POA: Diagnosis not present

## 2023-12-30 DIAGNOSIS — M19011 Primary osteoarthritis, right shoulder: Secondary | ICD-10-CM | POA: Diagnosis not present

## 2023-12-30 DIAGNOSIS — G8929 Other chronic pain: Secondary | ICD-10-CM

## 2023-12-30 DIAGNOSIS — Z01818 Encounter for other preprocedural examination: Secondary | ICD-10-CM

## 2024-01-12 DIAGNOSIS — H6693 Otitis media, unspecified, bilateral: Secondary | ICD-10-CM | POA: Diagnosis not present

## 2024-02-15 ENCOUNTER — Ambulatory Visit (INDEPENDENT_AMBULATORY_CARE_PROVIDER_SITE_OTHER): Payer: Medicare Other | Admitting: Pulmonary Disease

## 2024-02-15 ENCOUNTER — Encounter: Payer: Self-pay | Admitting: Pulmonary Disease

## 2024-02-15 VITALS — BP 152/84 | HR 60 | Temp 98.0°F | Resp 18 | Ht 66.0 in | Wt 179.8 lb

## 2024-02-15 DIAGNOSIS — R053 Chronic cough: Secondary | ICD-10-CM | POA: Diagnosis not present

## 2024-02-15 DIAGNOSIS — Z01818 Encounter for other preprocedural examination: Secondary | ICD-10-CM

## 2024-02-15 DIAGNOSIS — Z01811 Encounter for preprocedural respiratory examination: Secondary | ICD-10-CM

## 2024-02-15 DIAGNOSIS — M25551 Pain in right hip: Secondary | ICD-10-CM | POA: Diagnosis not present

## 2024-02-15 MED ORDER — BREZTRI AEROSPHERE 160-9-4.8 MCG/ACT IN AERO: 2.0000 | INHALATION_SPRAY | Freq: Two times a day (BID) | RESPIRATORY_TRACT | Status: DC

## 2024-02-15 NOTE — Progress Notes (Signed)
 @Patient  ID: Andrew Barnes, male    DOB: [birth year], 68 y.o.   MRN: [id number]  Chief Complaint  Patient presents with   Follow-up    Asthma    Referring provider: Barnetta Liberty, MD  HPI:   68 y.o. man whom we are seeing in follow up for evaluation of chronic cough now improved here for pre procedural evaluation.  Most recent orthopedic surgery note reviewed.  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  Prednisone  has helped in the past.  Prescribed Breztri  via samples.  Cough is essentially gone.  Not requiring inhalers.  Has not used Breztri  regularly in months.  Doing well.  Has upcoming shoulder surgery here for evaluation.   HPI at initial visit: Patient was usual state of health.  Contracted COVID 06/2022.  Initially improved symptom wise.  Then developed cough starting 09/2022.  Initially productive.  Now mostly dry.  No increased shortness of breath, wheezing etc.  Maybe sometimes worse mornings and evenings.  But present throughout the day.  No other relieving or exacerbating factors.  No seasonal environmental factors he can identify that make things better or worse.  No position make things better or worse.  He reports chest x-ray, I see this via PCP.  I cannot see images.  But reportedly normal.  He was given recent prednisone  course 20 mg daily.  No significant improvement per his report.  Not using maintenance inhalers.  Past medical history: Hypothyroid Surgical history: Lithotripsy, knee surgery, wrist surgery Family history:History reviewed. No pertinent family history. Social history: Never smoker, lives in [place] / Pulmonary Flowsheets:   ACT:      No data to display          MMRC:     No data to display          Epworth:      No data to display          Tests:   FENO:  No results found for: "NITRICOXIDE"  PFT:     No data to display          WALK:      No data to display           Imaging: Personally reviewed and as per EMR No results found.  Lab Results: Personally reviewed and as per EMR CBC    Component Value Date/Time   WBC 15.7 (H) 08/17/2018 0529   RBC 3.99 (L) 08/17/2018 0529   HGB 12.8 (L) 08/17/2018 0529   HCT 38.1 (L) 08/17/2018 0529   PLT 252 08/17/2018 0529   MCV 95.5 08/17/2018 0529   MCH 32.1 08/17/2018 0529   MCHC 33.6 08/17/2018 0529   RDW 12.3 08/17/2018 0529    BMET    Component Value Date/Time   NA 141 08/17/2018 0529   K 4.7 08/17/2018 0529   CL 105 08/17/2018 0529   CO2 26 08/17/2018 0529   GLUCOSE 160 (H) 08/17/2018 0529   BUN 15 08/17/2018 0529   CREATININE 1.13 08/17/2018 0529   CALCIUM 9.5 08/17/2018 0529   GFRNONAA >60 08/17/2018 0529   GFRAA >60 08/17/2018 0529    BNP No results found for: "BNP"  ProBNP No results found for: "PROBNP"  Specialty Problems   None   Allergies  Allergen Reactions   Azithromycin Nausea And Vomiting and Nausea Only   Erythromycin Nausea And Vomiting   Sulfa Antibiotics     Red splotches     There is  no immunization history on file for this patient.  Past Medical History:  Diagnosis Date   Arthritis    oa   History of kidney stones    Hyperlipidemia     Tobacco History: Social History   Tobacco Use  Smoking Status Never   Passive exposure: Past  Smokeless Tobacco Never   Counseling given: Not Answered   Continue to not smoke  Outpatient Encounter Medications as of 02/15/2024  Medication Sig   aspirin  EC 81 MG tablet Take 81 mg by mouth once.   Budeson-Glycopyrrol-Formoterol (BREZTRI  AEROSPHERE) 160-9-4.8 MCG/ACT AERO Inhale 2 puffs into the lungs in the morning and at bedtime.   Budeson-Glycopyrrol-Formoterol (BREZTRI  AEROSPHERE) 160-9-4.8 MCG/ACT AERO Inhale 2 puffs into the lungs in the morning and at bedtime.   levothyroxine  (SYNTHROID , LEVOTHROID) 50 MCG tablet Take 50 mcg by mouth daily.   Multiple Vitamin (MULTIVITAMIN WITH MINERALS) TABS tablet  Take 1 tablet by mouth every evening.   pravastatin  (PRAVACHOL ) 40 MG tablet Take 40 mg by mouth at bedtime.    traZODone (DESYREL) 100 MG tablet Take 100 mg by mouth at bedtime as needed.   No facility-administered encounter medications on file as of 02/15/2024.     Review of Systems  Review of Systems  N/a Physical Exam  BP (!) 152/84 (BP Location: Left Arm, Patient Position: Sitting, Cuff Size: Normal)   Pulse 60   Temp 98 F (36.7 C) (Oral)   Resp 18   Ht 5\' 6"  (1.676 m)   Wt 179 lb 12.8 oz (81.6 kg)   SpO2 97%   BMI 29.02 kg/m   Wt Readings from Last 5 Encounters:  02/15/24 179 lb 12.8 oz (81.6 kg)  04/16/23 157 lb (71.2 kg)  03/05/23 155 lb 9.6 oz (70.6 kg)  08/16/18 174 lb 2.6 oz (79 kg)  08/11/18 175 lb (79.4 kg)    BMI Readings from Last 5 Encounters:  02/15/24 29.02 kg/m  04/16/23 24.59 kg/m  03/05/23 24.37 kg/m  08/16/18 28.11 kg/m  08/11/18 28.25 kg/m     Physical Exam General sitting in chair in no acute distress Eyes: EOMI, no icterus Neck: Supple, no JVP Abdomen: Nondistended, bowel sounds present MSK: No synovitis, no joint effusion Neuro: Normal gait, no weakness Psych: Normal mood, full affect Pulmonary: Clear, normal work of breathing Cardiovascular: Warm no edema   Assessment & Plan:   Chronic cough likely postviral cough versus cough variant asthma: Few weeks after COVID infection in the fall 2023.  Primarily dry.  Slowly improving with time.  But fairly significant. Given triggered by what sounds like viral infection, cough variant asthma felt most likely.  No signs or symptoms of postnasal drip.  Denies reflux symptoms.  Chest imaging clear. Improved with prednisone  and Breztri .  Has not required maintenance inhaler in months.  Okay to stay off of this for now.  Can always resume in the future.  Preprocedural risk assessment: Pulmonary medicine does not provide preoperative clearance or other a risk assessment prior to procedure.   Based on the ARISCAT model patient is low at 1.6% risk of postoperative pulmonary complication assuming duration of surgery is 3 hours or less.  If duration of surgery is greater than 3 hours then the patient is intermediate at 13.3% risk of postoperative pulmonary complication.  No modifiable risk factors identified prior to surgery.  See below for recommendations. -- Please provide DuoNebs in the preoperative area in the PACU   Return in about 1 year (around 02/14/2025) for f/u  Dr. Marygrace Snellen.   Guerry Leek, MD 02/15/2024

## 2024-02-15 NOTE — Patient Instructions (Signed)
 Okay to stay off this for now, I will provide some samples to use just in case you get into a bind, see me a message I can always refill the Breztri .  I think the surgery will go well, I will provide recommendations in my note today.  Return to clinic in 1 year or sooner as needed with Dr. Marygrace Snellen

## 2024-02-15 NOTE — Addendum Note (Signed)
 Addended by: Augustus Blood on: 02/15/2024 10:18 AM   Modules accepted: Orders

## 2024-02-18 NOTE — Telephone Encounter (Addendum)
 Copy of note from 02/15/24 was faxed to [facility].

## 2024-02-19 DIAGNOSIS — H5712 Ocular pain, left eye: Secondary | ICD-10-CM | POA: Diagnosis not present

## 2024-02-23 DIAGNOSIS — M25551 Pain in right hip: Secondary | ICD-10-CM | POA: Diagnosis not present

## 2024-03-31 DIAGNOSIS — N401 Enlarged prostate with lower urinary tract symptoms: Secondary | ICD-10-CM | POA: Diagnosis not present

## 2024-03-31 DIAGNOSIS — N5201 Erectile dysfunction due to arterial insufficiency: Secondary | ICD-10-CM | POA: Diagnosis not present

## 2024-03-31 DIAGNOSIS — R351 Nocturia: Secondary | ICD-10-CM | POA: Diagnosis not present

## 2024-04-03 ENCOUNTER — Other Ambulatory Visit: Payer: Self-pay

## 2024-04-03 ENCOUNTER — Emergency Department (HOSPITAL_COMMUNITY)

## 2024-04-03 ENCOUNTER — Emergency Department (HOSPITAL_COMMUNITY)
Admission: EM | Admit: 2024-04-03 | Discharge: 2024-04-04 | Disposition: A | Discharge status: Home / Self Care | Attending: Emergency Medicine | Admitting: Emergency Medicine

## 2024-04-03 ENCOUNTER — Encounter (HOSPITAL_COMMUNITY): Payer: Self-pay | Admitting: Emergency Medicine

## 2024-04-03 DIAGNOSIS — R918 Other nonspecific abnormal finding of lung field: Secondary | ICD-10-CM | POA: Diagnosis not present

## 2024-04-03 DIAGNOSIS — Z79899 Other long term (current) drug therapy: Secondary | ICD-10-CM | POA: Diagnosis not present

## 2024-04-03 DIAGNOSIS — R780 Finding of alcohol in blood: Secondary | ICD-10-CM | POA: Insufficient documentation

## 2024-04-03 DIAGNOSIS — R410 Disorientation, unspecified: Secondary | ICD-10-CM | POA: Insufficient documentation

## 2024-04-03 DIAGNOSIS — R4182 Altered mental status, unspecified: Secondary | ICD-10-CM | POA: Diagnosis not present

## 2024-04-03 DIAGNOSIS — I4510 Unspecified right bundle-branch block: Secondary | ICD-10-CM | POA: Diagnosis not present

## 2024-04-03 DIAGNOSIS — I10 Essential (primary) hypertension: Secondary | ICD-10-CM | POA: Diagnosis not present

## 2024-04-03 DIAGNOSIS — I517 Cardiomegaly: Secondary | ICD-10-CM | POA: Diagnosis not present

## 2024-04-03 DIAGNOSIS — F10929 Alcohol use, unspecified with intoxication, unspecified: Secondary | ICD-10-CM | POA: Diagnosis not present

## 2024-04-03 DIAGNOSIS — R9431 Abnormal electrocardiogram [ECG] [EKG]: Secondary | ICD-10-CM | POA: Diagnosis not present

## 2024-04-03 LAB — CBG MONITORING, ED: Glucose-Capillary: 116 mg/dL — ABNORMAL HIGH (ref 70–99)

## 2024-04-03 LAB — CBC WITH DIFFERENTIAL/PLATELET
Abs Immature Granulocytes: 0.02 10*3/uL (ref 0.00–0.07)
Basophils Absolute: 0 10*3/uL (ref 0.0–0.1)
Basophils Relative: 1 %
Eosinophils Absolute: 0 10*3/uL (ref 0.0–0.5)
Eosinophils Relative: 0 %
HCT: 41.2 % (ref 39.0–52.0)
Hemoglobin: 14 g/dL (ref 13.0–17.0)
Immature Granulocytes: 0 %
Lymphocytes Relative: 27 %
Lymphs Abs: 1.6 10*3/uL (ref 0.7–4.0)
MCH: 32 pg (ref 26.0–34.0)
MCHC: 34 g/dL (ref 30.0–36.0)
MCV: 94.3 fL (ref 80.0–100.0)
Monocytes Absolute: 0.4 10*3/uL (ref 0.1–1.0)
Monocytes Relative: 7 %
Neutro Abs: 3.9 10*3/uL (ref 1.7–7.7)
Neutrophils Relative %: 65 %
Platelets: 278 10*3/uL (ref 150–400)
RBC: 4.37 MIL/uL (ref 4.22–5.81)
RDW: 13.1 % (ref 11.5–15.5)
WBC: 6 10*3/uL (ref 4.0–10.5)
nRBC: 0 % (ref 0.0–0.2)

## 2024-04-03 LAB — SALICYLATE LEVEL: Salicylate Lvl: <7 mg/dL — ABNORMAL LOW (ref 7.0–30.0)

## 2024-04-03 LAB — I-STAT CG4 LACTIC ACID, ED: Lactic Acid, Venous: 2 mmol/L (ref 0.5–1.9)

## 2024-04-03 LAB — COMPREHENSIVE METABOLIC PANEL WITH GFR
ALT: 19 U/L (ref 0–44)
AST: 26 U/L (ref 15–41)
Albumin: 4.1 g/dL (ref 3.5–5.0)
Alkaline Phosphatase: 52 U/L (ref 38–126)
Anion gap: 9 (ref 5–15)
BUN: 21 mg/dL (ref 8–23)
CO2: 22 mmol/L (ref 22–32)
Calcium: 9.1 mg/dL (ref 8.9–10.3)
Chloride: 112 mmol/L — ABNORMAL HIGH (ref 98–111)
Creatinine, Ser: 1.06 mg/dL (ref 0.61–1.24)
GFR, Estimated: >60 mL/min (ref >60)
Glucose, Bld: 116 mg/dL — ABNORMAL HIGH (ref 70–99)
Potassium: 3.9 mmol/L (ref 3.5–5.1)
Sodium: 143 mmol/L (ref 135–145)
Total Bilirubin: 0.7 mg/dL (ref 0.0–1.2)
Total Protein: 6.8 g/dL (ref 6.5–8.1)

## 2024-04-03 LAB — I-STAT VENOUS BLOOD GAS, ED
Acid-base deficit: 3 mmol/L — ABNORMAL HIGH (ref 0.0–2.0)
Bicarbonate: 22.5 mmol/L (ref 20.0–28.0)
Calcium, Ion: 1.19 mmol/L (ref 1.15–1.40)
HCT: 39 % (ref 39.0–52.0)
Hemoglobin: 13.3 g/dL (ref 13.0–17.0)
O2 Saturation: 87 %
Potassium: 4 mmol/L (ref 3.5–5.1)
Sodium: 146 mmol/L — ABNORMAL HIGH (ref 135–145)
TCO2: 24 mmol/L (ref 22–32)
pCO2, Ven: 39.5 mmHg — ABNORMAL LOW (ref 44–60)
pH, Ven: 7.363 (ref 7.25–7.43)
pO2, Ven: 56 mmHg — ABNORMAL HIGH (ref 32–45)

## 2024-04-03 LAB — URINALYSIS, ROUTINE W REFLEX MICROSCOPIC
Bilirubin Urine: NEGATIVE
Glucose, UA: NEGATIVE mg/dL
Hgb urine dipstick: NEGATIVE
Ketones, ur: NEGATIVE mg/dL
Leukocytes,Ua: NEGATIVE
Nitrite: NEGATIVE
Protein, ur: NEGATIVE mg/dL
Specific Gravity, Urine: 1.015 (ref 1.005–1.030)
pH: 5 (ref 5.0–8.0)

## 2024-04-03 LAB — ACETAMINOPHEN LEVEL: Acetaminophen (Tylenol), Serum: <10 ug/mL — ABNORMAL LOW (ref 10–30)

## 2024-04-03 LAB — ETHANOL: Alcohol, Ethyl (B): 60 mg/dL — ABNORMAL HIGH (ref <15)

## 2024-04-03 LAB — AMMONIA: Ammonia: 20 umol/L (ref 9–35)
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
## 2024-04-04 LAB — RAPID URINE DRUG SCREEN, HOSP PERFORMED
Amphetamines: NOT DETECTED
Barbiturates: NOT DETECTED
Benzodiazepines: NOT DETECTED
Cocaine: NOT DETECTED
Opiates: NOT DETECTED
Tetrahydrocannabinol: NOT DETECTED

## 2024-04-04 NOTE — ED Provider Notes (Signed)
 [facility]PT [facility] Emergency Department Provider Note MRN:  [id number]  Arrival date & time: 04/04/24     Chief Complaint   Altered Mental Status   History of Present Illness   Andrew Barnes is a 68 y.o. year-old male presents to the ED with chief complaint of confusion.  He states that he was confused earlier today.  His wife states that she found him in the car without any keys.  She states that he does not seem to remember anything that happened today.  Patient denies any complaints now.  He denies any recent illnesses.  Denies any pain.  His wife is suspicious that he had been drinking.  History provided by patient.   Review of Systems  Pertinent positive and negative review of systems noted in HPI.    Physical Exam   Vitals:   04/04/24 0000 04/04/24 0030  BP: (!) 150/93 (!) 146/59  Pulse: 66 61  Resp: 16 14  Temp:    SpO2: 99% 97%    CONSTITUTIONAL:  non toxic-appearing, NAD NEURO:  Alert and oriented x 3, CN 3-12 grossly intact EYES:  eyes equal and reactive ENT/NECK:  Supple, no stridor  CARDIO:  normal rate, regular rhythm, appears well-perfused  PULM:  No respiratory distress, CTAB GI/GU:  non-distended, no focal abdominal tenderness MSK/SPINE:  No gross deformities, no edema, moves all extremities  SKIN:  no rash, atraumatic   *Additional and/or pertinent findings included in MDM below  Diagnostic and Interventional Summary    EKG Interpretation Date/Time:  Sunday April 03 2024 22:34:10 EDT Ventricular Rate:  66 PR Interval:  158 QRS Duration:  141 QT Interval:  447 QTC Calculation: 469 R Axis:   -50  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 Confirmed by Kelsey Patricia [phone]) on 04/03/2024 10:47:04 PM       Labs Reviewed  COMPREHENSIVE METABOLIC PANEL WITH GFR - Abnormal; Notable for the following components:      Result Value   Chloride 112 (*)    Glucose, Bld 116 (*)    All other  components within normal limits  ETHANOL - Abnormal; Notable for the following components:   Alcohol , Ethyl (B) 60 (*)    All other components within normal limits  SALICYLATE LEVEL - Abnormal; Notable for the following components:   Salicylate Lvl <7.0 (*)    All other components within normal limits  ACETAMINOPHEN  LEVEL - Abnormal; Notable for the following components:   Acetaminophen  (Tylenol ), Serum <10 (*)    All other components within normal limits  CBG MONITORING, ED - Abnormal; Notable for the following components:   Glucose-Capillary 116 (*)    All other components within normal limits  I-STAT CG4 LACTIC ACID, ED - Abnormal; Notable for the following components:   Lactic Acid, Venous 2.0 (*)    All other components within normal limits  I-STAT VENOUS BLOOD GAS, ED - Abnormal; Notable for the following components:   pCO2, Ven 39.5 (*)    pO2, Ven 56 (*)    Acid-base deficit 3.0 (*)    Sodium 146 (*)    All other components within normal limits  AMMONIA  RAPID URINE DRUG SCREEN, HOSP PERFORMED  CBC WITH DIFFERENTIAL/PLATELET  URINALYSIS, ROUTINE W REFLEX MICROSCOPIC    CT HEAD WO CONTRAST  Final Result    DG Chest Port 1 View  Final Result      Medications - No data to display   Procedures  /  Critical Care Procedures  ED Course and Medical Decision Making  I have reviewed the triage vital signs, the nursing notes, and pertinent available records from the EMR.  Social Determinants Affecting Complexity of Care: Patient has no clinically significant social determinants affecting this chief complaint..   ED Course:    Medical Decision Making Patient here with reported episode of confusion earlier today.  On my exam, patient responds to all my questions appropriately.  He is in no acute distress.  Vital signs are stable.  I do not see any concerning signs of infection.  Patient's wife is concerned that he had been drinking, but he denied this.  Laboratory  workup is notable for an elevated ethanol level.  I suspect that this is the cause of the patient's reported confusion.  He seems to be sobering up just fine now.  He seems clinically sober enough for discharge.  He requests that his lab results not be shared with his family members.  I have honored this request as per HIPAA.  I did ask patient his reason behind drinking.  His wife has Parkinson's, but he states that he is dealing with this pretty well.  He denies any SI or HI.  I offered him an opportunity to talk with behavioral health or to have him evaluated for depression, but he states that he is doing well enough.  It is my opinion that he does have capacity to make his own medical decisions and I do believe him that he is not suicidal or homicidal.  I do suspect that he was drinking as a form of coping mechanism for life stressors, but I do not think him to be a danger to himself.  Amount and/or Complexity of Data Reviewed Labs: ordered. Radiology: ordered.         Consultants: No consultations were needed in caring for this patient.   Treatment and Plan: I considered admission due to patient's initial presentation, but after considering the examination and diagnostic results, patient will not require admission and can be discharged with outpatient follow-up.    Final Clinical Impressions(s) / ED Diagnoses     ICD-10-CM   1. Confusion  R41.0       ED Discharge Orders     None         Discharge Instructions Discussed with and Provided to Patient:   Discharge Instructions   None      Sherel Dikes, PA-C 04/04/24 0055    Kelsey Patricia, MD 04/04/24 [phone]

## 2024-04-07 ENCOUNTER — Other Ambulatory Visit: Payer: Self-pay

## 2024-04-07 ENCOUNTER — Encounter (HOSPITAL_BASED_OUTPATIENT_CLINIC_OR_DEPARTMENT_OTHER): Payer: Self-pay | Admitting: Orthopaedic Surgery

## 2024-04-07 NOTE — H&P (Signed)
 PREOPERATIVE H&P  Chief Complaint: right shoulder OA  HPI: Andrew Barnes is a 68 y.o. male who is scheduled for Procedure(s): ARTHROPLASTY, SHOULDER, TOTAL, REVERSE.   Patient has a past medical history significant for HLD.   Patient has had right shoulder pain for quite some time. He has an irreparable cuff tear. He has tried and failed injections.   Symptoms are rated as moderate to severe, and have been worsening.  This is significantly impairing activities of daily living.    Please see clinic note for further details on this patient's care.    He has elected for surgical management.   Past Medical History:  Diagnosis Date   Arthritis    oa   History of kidney stones    Hyperlipidemia    Past Surgical History:  Procedure Laterality Date   CYSTOSCOPY     x 1   EXTRACORPOREAL SHOCK WAVE LITHOTRIPSY     left knee cartlidge removed  high school   left knee lateral release     left knee patella with screw placement  teenager   left rotator cuff repair     left wrist cartlidge removed and bone shortened     right ear drum surgery     TOTAL KNEE ARTHROPLASTY Left 08/16/2018   Procedure: LEFT TOTAL KNEE ARTHROPLASTY;  Surgeon: Liliane Rei, MD;  Location: [facility];  Service: Orthopedics;  Laterality: Left;   Social History   Socioeconomic History   Marital status: Married    Spouse name: Not on file   Number of children: Not on file   Years of education: Not on file   Highest education level: Not on file  Occupational History   Not on file  Tobacco Use   Smoking status: Never    Passive exposure: Past   Smokeless tobacco: Never  Vaping Use   Vaping status: Never Used  Substance and Sexual Activity   Alcohol  use: Yes    Comment: 6 pack week beer   Drug use: Never   Sexual activity: Not on file  Other Topics Concern   Not on file  Social History Narrative   Not on file   Social Drivers of Health   Financial Resource Strain: Not on file  Food  Insecurity: Not on file  Transportation Needs: Not on file  Physical Activity: Not on file  Stress: Not on file  Social Connections: Not on file   History reviewed. No pertinent family history. Allergies  Allergen Reactions   Azithromycin Nausea And Vomiting and Nausea Only   Erythromycin Nausea And Vomiting   Sulfa Antibiotics     Red splotches   Prior to Admission medications   Medication Sig Start Date End Date Taking? Authorizing Provider  aspirin  EC 81 MG tablet Take 81 mg by mouth once. 07/01/16  Yes [provider]  levothyroxine  (SYNTHROID , LEVOTHROID) 50 MCG tablet Take 50 mcg by mouth daily. 07/02/18  Yes [provider]  pravastatin  (PRAVACHOL ) 40 MG tablet Take 40 mg by mouth at bedtime.  07/02/18  Yes [provider]  Budeson-Glycopyrrol-Formoterol (BREZTRI  AEROSPHERE) 160-9-4.8 MCG/ACT AERO Inhale 2 puffs into the lungs in the morning and at bedtime. 04/16/23   Hunsucker, Archer Kobs, MD  Budeson-Glycopyrrol-Formoterol (BREZTRI  AEROSPHERE) 160-9-4.8 MCG/ACT AERO Inhale 2 puffs into the lungs in the morning and at bedtime. 04/16/23   Hunsucker, Archer Kobs, MD  budeson-glycopyrrolate-formoterol (BREZTRI  AEROSPHERE) 160-9-4.8 MCG/ACT AERO inhaler Inhale 2 puffs into the lungs 2 (two) times daily. 02/15/24  Hunsucker, Archer Kobs, MD  Multiple Vitamin (MULTIVITAMIN WITH MINERALS) TABS tablet Take 1 tablet by mouth every evening.    [provider]  traZODone (DESYREL) 100 MG tablet Take 100 mg by mouth at bedtime as needed. 01/12/23   [provider]    ROS: All other systems have been reviewed and were otherwise negative with the exception of those mentioned in the HPI and as above.  Physical Exam: General: Alert, no acute distress Cardiovascular: No pedal edema Respiratory: No cyanosis, no use of accessory musculature GI: No organomegaly, abdomen is soft and non-tender Skin: No lesions in the area of chief complaint Neurologic: Sensation  intact distally Psychiatric: Patient is competent for consent with normal mood and affect Lymphatic: No axillary or cervical lymphadenopathy  MUSCULOSKELETAL:  The range of motion of the  right shoulder is to 150 degrees. Passive 170 degrees. Pain with cuff testing and weakness with supraspinatus and infraspinatus testing. External rotation to 60.   Imaging: CT of the right shoulder demonstrates complete tear of the supraspinatus and infraspinatus tendons. Moderate OA  BMI: Estimated body mass index is 27.44 kg/m as calculated from the following:   Height as of this encounter: 5' 6 (1.676 m).   Weight as of this encounter: 77.1 kg.  Lab Results  Component Value Date   ALBUMIN 4.1 04/03/2024   Diabetes: Patient does not have a diagnosis of diabetes.     Smoking Status:   reports that he has never smoked. He has been exposed to tobacco smoke. He has never used smokeless tobacco.     Assessment: right shoulder OA  Plan: Plan for Procedure(s): ARTHROPLASTY, SHOULDER, TOTAL, REVERSE  The risks benefits and alternatives were discussed with the patient including but not limited to the risks of nonoperative treatment, versus surgical intervention including infection, bleeding, nerve injury,  blood clots, cardiopulmonary complications, morbidity, mortality, among others, and they were willing to proceed.   We additionally specifically discussed risks of axillary nerve injury, infection, periprosthetic fracture, continued pain and longevity of implants prior to beginning procedure.    Patient will be closely monitored in PACU for medical stabilization and pain control. If found stable in PACU, patient may be discharged home with outpatient follow-up. If any concerns regarding patient's stabilization patient will be admitted for observation after surgery. The patient is planning to be discharged home with outpatient PT.   The patient acknowledged the explanation, agreed to proceed  with the plan and consent was signed.   Operative Plan: Right reverse total shoulder arthroplasty Discharge Medications: standard DVT Prophylaxis: aspirin  Physical Therapy: outpatient PT Special Discharge needs: Sling. IceMan   Adine Ahmadi, PA-C  04/07/2024 12:56 PM

## 2024-04-12 NOTE — Discharge Instructions (Addendum)
 HAD TYLENOL  at 0630 am. No tylenol  products for 6hrs, or after 12:30 pm     Bonner Hair MD, MPH Aleck Stalling, PA-C [facility] [address] [phone] (tel)   [phone] (fax)   POST-OPERATIVE INSTRUCTIONS - TOTAL SHOULDER REPLACEMENT    WOUND CARE You may leave the operative dressing in place until your follow-up appointment. KEEP THE INCISIONS CLEAN AND DRY. There may be a small amount of fluid/bleeding leaking at the surgical site. This is normal after surgery.  If it fills with liquid or blood please call us  immediately to change it for you. Use the provided ice machine or Ice packs as often as possible for the first 3-4 days, then as needed for pain relief.   Keep a layer of cloth or a shirt between your skin and the cooling unit to prevent frost bite as it can get very cold.  SHOWERING: - You may shower on Post-Op Day #2.  - The dressing is water resistant but do not scrub it as it may start to peel up.   - You may remove the sling for showering - Gently pat the area dry.  - Do not soak the shoulder in water.  - Do not go swimming in the pool or ocean until your incision has completely healed (about 4-6 weeks after surgery) - KEEP THE INCISIONS CLEAN AND DRY.  EXERCISES Wear the sling at all times  You may remove the sling for showering, but keep the arm across the chest or in a secondary sling.    Accidental/Purposeful External Rotation and shoulder flexion (reaching behind you) is to be avoided at all costs for the first month. It is ok to come out of your sling if your are sitting and have assistance for eating.   Do not lift anything heavier than 1 pound until we discuss it further in clinic.  It is normal for your fingers/hand to become more swollen after surgery and discolored from bruising.   This will resolve over the first few weeks usually after surgery. Please continue to ambulate and do not stay sitting or lying for  too long.  Perform foot and wrist pumps to assist in circulation.  PHYSICAL THERAPY - You will begin physical therapy soon after surgery (unless otherwise specified) - Please call to set up an appointment, if you do not already have one  - Let our office if there are any issues with scheduling your therapy  - You have a physical therapy appointment scheduled at [facility] (across the hall from our office) on 6/30   REGIONAL ANESTHESIA (NERVE BLOCKS) The anesthesia team may have performed a nerve block for you this is a great tool used to minimize pain.   The block may start wearing off overnight (between 8-24 hours postop) When the block wears off, your pain may go from nearly zero to the pain you would have had postop without the block. This is an abrupt transition but nothing dangerous is happening.   This can be a challenging period but utilize your as needed pain medications to try and manage this period. We suggest you use the pain medication the first night prior to going to bed, to ease this transition.  You may take an extra dose of narcotic when this happens if needed   POST-OP MEDICATIONS- Multimodal approach to pain control In general your pain will be controlled with a combination of substances.  Prescriptions unless otherwise discussed are electronically sent to  your pharmacy.  This is a carefully made plan we use to minimize narcotic use.     Celebrex - Anti-inflammatory medication taken on a scheduled basis Acetaminophen  - Non-narcotic pain medicine taken on a scheduled basis  Oxycodone  - This is a strong narcotic, to be used only on an "as needed" basis for SEVERE pain. Aspirin  81mg  - This medicine is used to minimize the risk of blood clots after surgery. Robaxin  - this is a muscle relaxer, take as needed for muscle spasms Zofran  -  take as needed for nausea   **MEDICATIONS WERE SENT LAST WEEK TO [facility] [address]**  FOLLOW-UP If you develop a Fever (>101.5),  Redness or Drainage from the surgical incision site, please call our office to arrange for an evaluation. Please call the office to schedule a follow-up appointment for a wound check, 7-10 days post-operatively.  IF YOU HAVE ANY QUESTIONS, PLEASE FEEL FREE TO CALL OUR OFFICE.  HELPFUL INFORMATION  Your arm will be in a sling following surgery. You will be in this sling for the next 4 weeks.   You may be more comfortable sleeping in a semi-seated position the first few nights following surgery.  Keep a pillow propped under the elbow and forearm for comfort.  If you have a recliner type of chair it might be beneficial.  If not that is fine too, but it would be helpful to sleep propped up with pillows behind your operated shoulder as well under your elbow and forearm.  This will reduce pulling on the suture lines.  When dressing, put your operative arm in the sleeve first.  When getting undressed, take your operative arm out last.  Loose fitting, button-down shirts are recommended.  In most states it is against the law to drive while your arm is in a sling. And certainly against the law to drive while taking narcotics.  You may return to work/school in the next couple of days when you feel up to it. Desk work and typing in the sling is fine.  We suggest you use the pain medication the first night prior to going to bed, in order to ease any pain when the anesthesia wears off. You should avoid taking pain medications on an empty stomach as it will make you nauseous.  You should wean off your narcotic medicines as soon as you are able.     Most patients will be off narcotics before their first postop appointment.   Do not drink alcoholic beverages or take illicit drugs when taking pain medications.  Pain medication may make you constipated.  Below are a few solutions to try in this order: Decrease the amount of pain medication if you aren't having pain. Drink lots of decaffeinated fluids. Drink  prune juice and/or each dried prunes  If the first 3 don't work start with additional solutions Take Colace - an over-the-counter stool softener Take Senokot - an over-the-counter laxative Take Miralax  - a stronger over-the-counter laxative   Dental Antibiotics:  We require dental prophylaxis for 2 years after a shoulder replacement  Contact your surgeon for an antibiotic prescription, prior to your dental procedure.   For more information including helpful videos and documents visit our website:   [email or web address]  Donjoy Ultrasling III (Red ball):  Please contact your surgeon if you have questions or concerns about your sling.   Information for Discharge Teaching: EXPAREL  (bupivacaine  liposome injectable suspension)   Pain relief is important to your recovery. The goal is to control  your pain so you can move easier and return to your normal activities as soon as possible after your procedure. Your physician may use several types of medicines to manage pain, swelling, and more.  Your surgeon or anesthesiologist gave you EXPAREL (bupivacaine ) to help control your pain after surgery.  EXPAREL  is a local anesthetic designed to release slowly over an extended period of time to provide pain relief by numbing the tissue around the surgical site. EXPAREL  is designed to release pain medication over time and can control pain for up to 72 hours. Depending on how you respond to EXPAREL , you may require less pain medication during your recovery. EXPAREL  can help reduce or eliminate the need for opioids during the first few days after surgery when pain relief is needed the most. EXPAREL  is not an opioid and is not addictive. It does not cause sleepiness or sedation.   Important! A teal colored band has been placed on your arm with the date, time and amount of EXPAREL  you have received. Please leave this armband in place for the full 96 hours following  administration, and then you may remove the band. If you return to the hospital for any reason within 96 hours following the administration of EXPAREL , the armband provides important information that your health care providers to know, and alerts them that you have received this anesthetic.    Possible side effects of EXPAREL : Temporary loss of sensation or ability to move in the area where medication was injected. Nausea, vomiting, constipation Rarely, numbness and tingling in your mouth or lips, lightheadedness, or anxiety may occur. Call your doctor right away if you think you may be experiencing any of these sensations, or if you have other questions regarding possible side effects.  Follow all other discharge instructions given to you by your surgeon or nurse. Eat a healthy diet and drink plenty of water or other fluids.

## 2024-04-12 NOTE — Progress Notes (Signed)
 Pt given the Benzoyl peroxide gel and surgical soap with written instructions on how to use both.  Pt verbalized understandings.

## 2024-04-13 ENCOUNTER — Encounter (HOSPITAL_BASED_OUTPATIENT_CLINIC_OR_DEPARTMENT_OTHER): Payer: Self-pay | Admitting: Orthopaedic Surgery

## 2024-04-14 ENCOUNTER — Ambulatory Visit (HOSPITAL_COMMUNITY)

## 2024-04-14 ENCOUNTER — Ambulatory Visit (HOSPITAL_BASED_OUTPATIENT_CLINIC_OR_DEPARTMENT_OTHER): Payer: Self-pay | Admitting: Certified Registered Nurse Anesthetist

## 2024-04-14 ENCOUNTER — Encounter (HOSPITAL_BASED_OUTPATIENT_CLINIC_OR_DEPARTMENT_OTHER)
Admission: RE | Disposition: A | Discharge status: Home / Self Care | Payer: Self-pay | Source: Home / Self Care | Attending: Orthopaedic Surgery

## 2024-04-14 ENCOUNTER — Other Ambulatory Visit: Payer: Self-pay

## 2024-04-14 ENCOUNTER — Ambulatory Visit (HOSPITAL_BASED_OUTPATIENT_CLINIC_OR_DEPARTMENT_OTHER)
Admission: RE | Admit: 2024-04-14 | Discharge: 2024-04-14 | Disposition: A | Discharge status: Home / Self Care | Payer: Self-pay | Attending: Orthopaedic Surgery | Admitting: Orthopaedic Surgery

## 2024-04-14 ENCOUNTER — Encounter (HOSPITAL_BASED_OUTPATIENT_CLINIC_OR_DEPARTMENT_OTHER): Payer: Self-pay | Admitting: Orthopaedic Surgery

## 2024-04-14 DIAGNOSIS — M25711 Osteophyte, right shoulder: Secondary | ICD-10-CM | POA: Insufficient documentation

## 2024-04-14 DIAGNOSIS — M75111 Incomplete rotator cuff tear or rupture of right shoulder, not specified as traumatic: Secondary | ICD-10-CM | POA: Diagnosis not present

## 2024-04-14 DIAGNOSIS — M19011 Primary osteoarthritis, right shoulder: Secondary | ICD-10-CM

## 2024-04-14 DIAGNOSIS — M75101 Unspecified rotator cuff tear or rupture of right shoulder, not specified as traumatic: Secondary | ICD-10-CM | POA: Diagnosis not present

## 2024-04-14 DIAGNOSIS — Z7722 Contact with and (suspected) exposure to environmental tobacco smoke (acute) (chronic): Secondary | ICD-10-CM | POA: Insufficient documentation

## 2024-04-14 DIAGNOSIS — E785 Hyperlipidemia, unspecified: Secondary | ICD-10-CM | POA: Diagnosis not present

## 2024-04-14 DIAGNOSIS — Z96611 Presence of right artificial shoulder joint: Secondary | ICD-10-CM | POA: Diagnosis not present

## 2024-04-14 DIAGNOSIS — Z01818 Encounter for other preprocedural examination: Secondary | ICD-10-CM

## 2024-04-14 DIAGNOSIS — G8918 Other acute postprocedural pain: Secondary | ICD-10-CM | POA: Diagnosis not present

## 2024-04-14 DIAGNOSIS — E039 Hypothyroidism, unspecified: Secondary | ICD-10-CM | POA: Diagnosis not present

## 2024-04-14 HISTORY — PX: REVERSE SHOULDER ARTHROPLASTY: SHX5054

## 2024-04-14 SURGERY — ARTHROPLASTY, SHOULDER, TOTAL, REVERSE
Anesthesia: Regional | Site: Shoulder | Laterality: Right

## 2024-04-14 MED ORDER — GABAPENTIN 300 MG PO CAPS
ORAL_CAPSULE | ORAL | Status: AC
  Filled 2024-04-14: qty 1

## 2024-04-14 MED ORDER — FENTANYL CITRATE (PF) 100 MCG/2ML IJ SOLN
INTRAMUSCULAR | Status: AC
  Filled 2024-04-14: qty 2

## 2024-04-14 MED ORDER — DEXAMETHASONE SODIUM PHOSPHATE 4 MG/ML IJ SOLN
INTRAMUSCULAR | Status: DC | PRN
  Administered 2024-04-14: 5 mg via INTRAVENOUS

## 2024-04-14 MED ORDER — PROPOFOL 10 MG/ML IV BOLUS
INTRAVENOUS | Status: DC | PRN
  Administered 2024-04-14: 150 mg via INTRAVENOUS

## 2024-04-14 MED ORDER — EPHEDRINE SULFATE (PRESSORS) 50 MG/ML IJ SOLN
INTRAMUSCULAR | Status: DC | PRN
  Administered 2024-04-14 (×3): 10 mg via INTRAVENOUS
  Administered 2024-04-14: 5 mg via INTRAVENOUS

## 2024-04-14 MED ORDER — AMISULPRIDE (ANTIEMETIC) 5 MG/2ML IV SOLN: 10.0000 mg | Freq: Once | INTRAVENOUS | Status: DC | PRN

## 2024-04-14 MED ORDER — BUPIVACAINE LIPOSOME 1.3 % IJ SUSP
INTRAMUSCULAR | Status: DC | PRN
  Administered 2024-04-14: 10 mL via PERINEURAL

## 2024-04-14 MED ORDER — PHENYLEPHRINE 80 MCG/ML (10ML) SYRINGE FOR IV PUSH (FOR BLOOD PRESSURE SUPPORT)
PREFILLED_SYRINGE | INTRAVENOUS | Status: AC
  Filled 2024-04-14: qty 10

## 2024-04-14 MED ORDER — ONDANSETRON HCL 4 MG/2ML IJ SOLN
INTRAMUSCULAR | Status: AC
  Filled 2024-04-14: qty 2

## 2024-04-14 MED ORDER — ONDANSETRON HCL 4 MG/2ML IJ SOLN
INTRAMUSCULAR | Status: DC | PRN
  Administered 2024-04-14: 4 mg via INTRAVENOUS

## 2024-04-14 MED ORDER — CEFAZOLIN SODIUM-DEXTROSE 2-4 GM/100ML-% IV SOLN
2.0000 g | INTRAVENOUS | Status: AC
  Administered 2024-04-14: 2 g via INTRAVENOUS

## 2024-04-14 MED ORDER — ACETAMINOPHEN 500 MG PO TABS
ORAL_TABLET | ORAL | Status: AC
  Filled 2024-04-14: qty 2

## 2024-04-14 MED ORDER — LACTATED RINGERS IV SOLN: INTRAVENOUS | Status: DC

## 2024-04-14 MED ORDER — VANCOMYCIN HCL 1000 MG IV SOLR
INTRAVENOUS | Status: AC
  Filled 2024-04-14: qty 20

## 2024-04-14 MED ORDER — EPHEDRINE 5 MG/ML INJ
INTRAVENOUS | Status: AC
  Filled 2024-04-14: qty 5

## 2024-04-14 MED ORDER — ONDANSETRON HCL 4 MG/2ML IJ SOLN: 4.0000 mg | Freq: Once | INTRAMUSCULAR | Status: DC | PRN

## 2024-04-14 MED ORDER — GABAPENTIN 300 MG PO CAPS
300.0000 mg | ORAL_CAPSULE | Freq: Once | ORAL | Status: AC
  Administered 2024-04-14: 300 mg via ORAL

## 2024-04-14 MED ORDER — TRANEXAMIC ACID-NACL 1000-0.7 MG/100ML-% IV SOLN
1000.0000 mg | INTRAVENOUS | Status: AC
Start: 2024-04-14 — End: 2024-04-14
  Administered 2024-04-14: 1000 mg via INTRAVENOUS

## 2024-04-14 MED ORDER — FENTANYL CITRATE (PF) 100 MCG/2ML IJ SOLN
INTRAMUSCULAR | Status: DC | PRN
  Administered 2024-04-14: 50 ug via INTRAVENOUS

## 2024-04-14 MED ORDER — LIDOCAINE 2% (20 MG/ML) 5 ML SYRINGE
INTRAMUSCULAR | Status: AC
  Filled 2024-04-14: qty 5

## 2024-04-14 MED ORDER — DEXAMETHASONE SODIUM PHOSPHATE 10 MG/ML IJ SOLN
INTRAMUSCULAR | Status: AC
Start: 2024-04-14 — End: 2024-04-14
  Filled 2024-04-14: qty 1

## 2024-04-14 MED ORDER — PROPOFOL 10 MG/ML IV BOLUS
INTRAVENOUS | Status: AC
  Filled 2024-04-14: qty 20

## 2024-04-14 MED ORDER — MIDAZOLAM HCL 2 MG/2ML IJ SOLN
INTRAMUSCULAR | Status: AC
  Filled 2024-04-14: qty 2

## 2024-04-14 MED ORDER — SUGAMMADEX SODIUM 200 MG/2ML IV SOLN
INTRAVENOUS | Status: DC | PRN
  Administered 2024-04-14: 200 mg via INTRAVENOUS

## 2024-04-14 MED ORDER — CEFAZOLIN SODIUM-DEXTROSE 2-4 GM/100ML-% IV SOLN
INTRAVENOUS | Status: AC
  Filled 2024-04-14: qty 100

## 2024-04-14 MED ORDER — 0.9 % SODIUM CHLORIDE (POUR BTL) OPTIME
TOPICAL | Status: DC | PRN
  Administered 2024-04-14: 1000 mL

## 2024-04-14 MED ORDER — TRANEXAMIC ACID-NACL 1000-0.7 MG/100ML-% IV SOLN
INTRAVENOUS | Status: AC
  Filled 2024-04-14: qty 100

## 2024-04-14 MED ORDER — LIDOCAINE HCL (CARDIAC) PF 100 MG/5ML IV SOSY
PREFILLED_SYRINGE | INTRAVENOUS | Status: DC | PRN
  Administered 2024-04-14: 50 mg via INTRAVENOUS

## 2024-04-14 MED ORDER — ACETAMINOPHEN 500 MG PO TABS
1000.0000 mg | ORAL_TABLET | Freq: Once | ORAL | Status: AC
  Administered 2024-04-14: 1000 mg via ORAL

## 2024-04-14 MED ORDER — VANCOMYCIN HCL 1000 MG IV SOLR
INTRAVENOUS | Status: DC | PRN
Start: 2024-04-14 — End: 2024-04-14
  Administered 2024-04-14: 1000 mg via TOPICAL

## 2024-04-14 MED ORDER — BUPIVACAINE HCL (PF) 0.5 % IJ SOLN
INTRAMUSCULAR | Status: DC | PRN
  Administered 2024-04-14: 15 mL via PERINEURAL

## 2024-04-14 MED ORDER — ROCURONIUM BROMIDE 10 MG/ML (PF) SYRINGE
PREFILLED_SYRINGE | INTRAVENOUS | Status: AC
  Filled 2024-04-14: qty 10

## 2024-04-14 MED ORDER — FENTANYL CITRATE (PF) 100 MCG/2ML IJ SOLN: 25.0000 ug | INTRAMUSCULAR | Status: DC | PRN

## 2024-04-14 MED ORDER — ROCURONIUM BROMIDE 100 MG/10ML IV SOLN
INTRAVENOUS | Status: DC | PRN
  Administered 2024-04-14: 50 mg via INTRAVENOUS

## 2024-04-14 MED ORDER — FENTANYL CITRATE (PF) 100 MCG/2ML IJ SOLN: 100.0000 ug | Freq: Once | INTRAMUSCULAR | Status: DC

## 2024-04-14 MED ORDER — PHENYLEPHRINE HCL (PRESSORS) 10 MG/ML IV SOLN
INTRAVENOUS | Status: DC | PRN
Start: 2024-04-14 — End: 2024-04-14
  Administered 2024-04-14: 160 ug via INTRAVENOUS
  Administered 2024-04-14: 80 ug via INTRAVENOUS
  Administered 2024-04-14 (×3): 160 ug via INTRAVENOUS
  Administered 2024-04-14: 80 ug via INTRAVENOUS
  Administered 2024-04-14 (×2): 160 ug via INTRAVENOUS
  Administered 2024-04-14: 80 ug via INTRAVENOUS

## 2024-04-14 MED ORDER — MIDAZOLAM HCL 2 MG/2ML IJ SOLN
2.0000 mg | Freq: Once | INTRAMUSCULAR | Status: AC
  Administered 2024-04-14: 1 mg via INTRAVENOUS

## 2024-04-14 SURGICAL SUPPLY — 58 items
AUGMENT BASEPLATE 15DEG 25 WDG (Joint) IMPLANT
BIT DRILL 3.2 PERIPHERAL SCREW (BIT) IMPLANT
BLADE SAW SGTL 73X25 THK (BLADE) ×2 IMPLANT
BLADE SURG 10 STRL SS (BLADE) IMPLANT
BLADE SURG 15 STRL LF DISP TIS (BLADE) IMPLANT
BRUSH SCRUB EZ PLAIN DRY (MISCELLANEOUS) ×2 IMPLANT
CHLORAPREP W/TINT 26 (MISCELLANEOUS) ×2 IMPLANT
CLSR STERI-STRIP ANTIMIC 1/2X4 (GAUZE/BANDAGES/DRESSINGS) ×2 IMPLANT
COOLER ICEMAN CLASSIC (MISCELLANEOUS) ×2 IMPLANT
COVER BACK TABLE 60X90IN (DRAPES) ×2 IMPLANT
COVER MAYO STAND STRL (DRAPES) ×2 IMPLANT
DRAPE IMP U-DRAPE 54X76 (DRAPES) IMPLANT
DRAPE INCISE IOBAN 66X45 STRL (DRAPES) ×2 IMPLANT
DRAPE POUCH INSTRU U-SHP 10X18 (DRAPES) ×2 IMPLANT
DRAPE U-SHAPE 76X120 STRL (DRAPES) ×4 IMPLANT
DRSG AQUACEL AG ADV 3.5X 6 (GAUZE/BANDAGES/DRESSINGS) ×2 IMPLANT
ELECTRODE BLDE 4.0 EZ CLN MEGD (MISCELLANEOUS) ×2 IMPLANT
ELECTRODE REM PT RTRN 9FT ADLT (ELECTROSURGICAL) ×2 IMPLANT
FACESHIELD WRAPAROUND (MASK) ×2 IMPLANT
FACESHIELD WRAPAROUND OR TEAM (MASK) ×4 IMPLANT
GAUZE XEROFORM 1X8 LF (GAUZE/BANDAGES/DRESSINGS) IMPLANT
GLENOSPHERE REV SHOULDER 36 (Joint) IMPLANT
GLOVE BIO SURGEON STRL SZ 6.5 (GLOVE) ×4 IMPLANT
GLOVE BIOGEL PI IND STRL 6.5 (GLOVE) ×2 IMPLANT
GLOVE BIOGEL PI IND STRL 8 (GLOVE) ×2 IMPLANT
GLOVE ECLIPSE 8.0 STRL XLNG CF (GLOVE) ×4 IMPLANT
GOWN STRL REUS W/ TWL LRG LVL3 (GOWN DISPOSABLE) ×4 IMPLANT
GOWN STRL REUS W/TWL XL LVL3 (GOWN DISPOSABLE) ×2 IMPLANT
GUIDEWIRE GLENOID 2.5X220 (WIRE) IMPLANT
INSERT HUM PERFORM 3/4 36 +0 (Insert) IMPLANT
KIT STABILIZATION SHOULDER (MISCELLANEOUS) ×2 IMPLANT
MANIFOLD NEPTUNE II (INSTRUMENTS) ×2 IMPLANT
PACK BASIN DAY SURGERY FS (CUSTOM PROCEDURE TRAY) ×2 IMPLANT
PACK SHOULDER (CUSTOM PROCEDURE TRAY) ×2 IMPLANT
PAD COLD SHLDR WRAP-ON (PAD) ×2 IMPLANT
PIN GUIDE 3X75 SHOULDER (PIN) IMPLANT
RESTRAINT HEAD UNIVERSAL NS (MISCELLANEOUS) ×2 IMPLANT
SCREW 5.0X18 (Screw) IMPLANT
SCREW 5.5X22 (Screw) IMPLANT
SCREW 5.5X26 (Screw) IMPLANT
SCREW BONE INTRNL SM 7 (Screw) IMPLANT
SCREW BONE THREAD 6.5X35 (Screw) IMPLANT
SCREW PERIPHERAL 5.0X34 (Screw) IMPLANT
SET HNDPC FAN SPRY TIP SCT (DISPOSABLE) ×2 IMPLANT
SHEET MEDIUM DRAPE 40X70 STRL (DRAPES) ×2 IMPLANT
SLEEVE SCD COMPRESS KNEE MED (STOCKING) ×2 IMPLANT
SPIKE FLUID TRANSFER (MISCELLANEOUS) IMPLANT
SPONGE T-LAP 18X18 ~~LOC~~+RFID (SPONGE) ×2 IMPLANT
STEM HUMERAL STD SHORT SZ3 (Joint) IMPLANT
SUT ETHIBOND 2 V 37 (SUTURE) ×2 IMPLANT
SUT ETHIBOND NAB CT1 #1 30IN (SUTURE) ×2 IMPLANT
SUT ETHILON 3 0 PS 1 (SUTURE) IMPLANT
SUT MNCRL AB 4-0 PS2 18 (SUTURE) ×2 IMPLANT
SUT VIC AB 0 CT1 27XBRD ANBCTR (SUTURE) IMPLANT
SUT VIC AB 3-0 SH 27X BRD (SUTURE) ×2 IMPLANT
SUTURE FIBERWR #5 38 CONV NDL (SUTURE) ×4 IMPLANT
TOWEL GREEN STERILE FF (TOWEL DISPOSABLE) ×6 IMPLANT
TUBE SUCTION HIGH CAP CLEAR NV (SUCTIONS) ×2 IMPLANT

## 2024-04-14 NOTE — Progress Notes (Signed)
Assisted Dr. Ellender with right, interscalene , ultrasound guided block. Side rails up, monitors on throughout procedure. See vital signs in flow sheet. Tolerated Procedure well. ?

## 2024-04-14 NOTE — Op Note (Signed)
 Orthopaedic Surgery Operative Note (CSN: [id number])  Andrew Barnes  Mar 23, 1956 Date of Surgery: 04/14/2024   Diagnoses:  Right shoulder irreparable rotator cuff tear  Procedure: Right reverse total Shoulder Arthroplasty   Operative Finding Successful completion of planned procedure.  Patient's glenoid bone quality was somewhat atypical and there was perforation of the glenoid vault.  Additionally the glenoid vault was quite soft and there was a mismatch between the central boss drill location and the path of the central screw down the scapular body.  Based on this we did not have as robust fixation initially as we had hoped and I attempted to use a short post and eventually switch back to a longer 6.5 millimeter screw which obtained great significant purchase.  That said I was worried about ingrowth and I would hold on therapy for a month.  Humeral side quite routine.  Post-operative plan: The patient will be NWB in sling.  The patient will be will be discharged from PACU if continues to be stable as was plan prior to surgery.  DVT prophylaxis Aspirin  81 mg twice daily for 6 weeks.  Pain control with PRN pain medication preferring oral medicines.  Follow up plan will be scheduled in approximately 7 days for incision check and XR.  Physical therapy to start after a month.  Implants: Tornier perform humeral size 3 stem, 0 polyethylene, 36 glenosphere with a 25 full wedge baseplate and a 6.5 x 35 center screw with 4 peripheral screws  Post-Op Diagnosis: Same Surgeons:Primary: Cristy Bonner DASEN, MD Assistants:Caroline McBane, PA-C Location: [facility] OR ROOM 6 Anesthesia: General with Exparel  Interscalene Antibiotics: Ancef  2g preop, Vancomycin 1000mg  locally Tourniquet time: None Estimated Blood Loss: 100 Complications: None Specimens: None Implants: Implant Name Type Inv. Item Serial No. Manufacturer Lot No. LRB No. Used Action  AUGMENT BASEPLATE 15DEG 25 WDG - [id number] Joint AUGMENT  BASEPLATE 15DEG 25 WDG [id number] [employer]  Right 1 Implanted  GLENOSPHERE REV SHOULDER 36 - [id number] Joint GLENOSPHERE REV SHOULDER 36 [id number] [employer]  Right 1 Implanted  SCREW BONE INTRNL SM 7 - [id number] Screw SCREW BONE INTRNL SM 7 [id number] [employer]  Right 1 Implanted  STEM HUMERAL STD SHORT SZ3 - [id number] Joint STEM HUMERAL STD SHORT SZ3 [id number] [employer]  Right 1 Implanted  INSERT HUM PERFORM 3/4 36 +0 - [id number] Insert INSERT HUM PERFORM 3/4 36 +0 [id number] [employer]  Right 1 Implanted  SCREW BONE THREAD 6.5X35 - [id number] Screw SCREW BONE THREAD 6.5X35  [employer]  Right 1 Implanted  SCREW PERIPHERAL 5.0X34 - [id number] Screw SCREW PERIPHERAL 5.0X34  [employer]  Right 1 Implanted  SCREW 5.5X22 - [id number] Screw SCREW 5.5X22  [employer]  Right 1 Implanted  SCREW 5.0X18 - [id number] Screw SCREW 5.0X18  [employer]  Right 1 Implanted  SCREW 5.5X26 - [id number] Screw SCREW 5.5X26  [employer]  Right 1 Implanted    Indications for Surgery:   Andrew Barnes is a 68 y.o. male with irreparable cuff tear.  Benefits and risks of operative and nonoperative management were discussed prior to surgery with patient/guardian(s) and informed consent form was completed.  Infection and need for further surgery were discussed as was prosthetic stability and cuff issues.  We additionally specifically discussed risks of axillary nerve injury, infection, periprosthetic fracture, continued pain and longevity of implants prior to beginning procedure.      Procedure:   The patient was identified in the preoperative holding area where the surgical site was marked.  Block placed by anesthesia with exparel .  The patient was taken to the OR where a procedural timeout was called and the above noted anesthesia was induced.  The patient was positioned beachchair on allen table with spider arm positioner.  Preoperative antibiotics were dosed.  The patient's right shoulder was prepped and  draped in the usual sterile fashion.  A second preoperative timeout was called.       Standard deltopectoral approach was performed with a #10 blade. We dissected down to the subcutaneous tissues and the cephalic vein was taken laterally with the deltoid. Clavipectoral fascia was incised in line with the incision. Deep retractors were placed. The long of the biceps tendon was identified and there was significant tenosynovitis present.  Tenodesis was performed to the pectoralis tendon with #2 Ethibond. The remaining biceps was followed up into the rotator interval where it was released.   The subscapularis was taken down in a full thickness layer with capsule along the humeral neck extending inferiorly around the humeral head. We continued releasing the capsule directly off of the osteophytes inferiorly all the way around the corner. This allowed us  to dislocate the humeral head.   The humeral head had evidence of severe osteoarthritic wear with full-thickness cartilage loss and exposed subchondral bone.   The rotator cuff was carefully examined and noted to be irreperably torn.  The decision was confirmed that a reverse total shoulder was indicated for this patient.  There were osteophytes along the inferior humeral neck. The osteophytes were removed with an osteotome and a rongeur.  Osteophytes were removed with a rongeur and an osteotome and the anatomic neck was well visualized.     A humeral cutting guide was used extra medullary with a pin to help control version. The version was set at 20 of retroversion. Humeral osteotomy was performed with an oscillating saw. The head fragment was passed off the back table.  A cut protector plate was placed.  The subscapularis was again identified and immediately we took care to palpate the axillary nerve anteriorly and verify its position with gentle palpation as well as the tug test.  We then released the SGHL with bovie cautery prior to placing a curved  mayo at the junction of the anterior glenoid well above the axillary nerve and bluntly dissecting the subscapularis from the capsule.  We then carefully protected the axillary nerve as we gently released the inferior capsule to fully mobilize the subscapularis.  An anterior deltoid retractor was then placed as well as a small Hohmann retractor superiorly.   The glenoid was inspected and had evidence of severe osteoarthritic wear with full-thickness cartilage loss and exposed subchondral bone.   The remaining labrum was removed circumferentially taking great care not to disrupt the posterior capsule.   At this point we felt based on blueprint templating that a full wedge augment was necessary.  We began by using a full wedge guide to place our center pin as was templated.  We had good position of this pin and we proceeded with our starter center drill.  This allowed for us  to use the 15 degree full wedge reamer obtaining circumferential witness marks and good bone preparation for ingrowth.  At this point we proceeded with our center drill and had an intact vault.  We then drilled our center screw to a length of 30 mm.    We initially proceeded with a 30 mm screw however based on the tap position I was worried that the purchase  would not be reasonable.  We have switched to a 9.11mm screw which again still did not have the purchase I was looking for.  We then removed our implant and refills using an alternate centerline technique, McBane centerline of the scapularis and eccentric loss for alignment.  After readdressing the central boss we obtained great purchase with a 6.5 x 35 center screw.  More peripheral screws were placed.   Next a 36 mm glenosphere was selected and impacted onto the baseplate. The center screw was tightened.  We turned attention back to the humeral side. The cut protector was removed.  We used the perform humeral sizing block to select the appropriate size which for this patient was a  3.  We then placed our center pin and reamed over it concentrically obtaining appropriate inset.  We then used our lateralizing chisel to prepare the lateral aspect of the humerus.  At that point we selected the appropriate implant trialing a 3.  Using this trial implant we trialed multiple polyethylene sizes settling on a 0 which provided good stability and range of motion without excess soft tissue tension. The offset was dialed in to match the normal anatomy. The shoulder was trialed.  There was good ROM in all planes and the shoulder was stable with no inferior translation.  The real humeral implants were opened after again confirming sizes.  The trial was removed. #5 Fiberwire x4 sutures passed through the humeral neck for subscap repair. The humeral component was press-fit obtaining a secure fit. The joint was reduced and thoroughly irrigated with pulsatile lavage. Subscap was repaired back with #5 Fiberwire sutures through bone tunnels. Hemostasis was obtained. The deltopectoral interval was reapproximated with #1 Ethibond. The subcutaneous tissues were closed with 2-0 Vicryl and the skin was closed with running monocryl.    The wounds were cleaned and dried and an Aquacel dressing was placed. The drapes taken down. The arm was placed into sling with abduction pillow. Patient was awakened, extubated, and transferred to the recovery room in stable condition. There were no intraoperative complications. The sponge, needle, and attention counts were  correct at the end of the case.     Aleck Stalling, PA-C, present and scrubbed throughout the case, critical for completion in a timely fashion, and for retraction, instrumentation, closure.

## 2024-04-14 NOTE — Interval H&P Note (Signed)
 All questions answered, patient wants to proceed with procedure. ? ?

## 2024-04-14 NOTE — Anesthesia Procedure Notes (Signed)
 Procedure Name: Intubation Date/Time: 04/14/2024 8:01 AM  Performed by: Buster Catheryn SAUNDERS, CRNAPre-anesthesia Checklist: Patient identified, Emergency Drugs available, Suction available and Patient being monitored Patient Re-evaluated:Patient Re-evaluated prior to induction Oxygen Delivery Method: Circle system utilized Preoxygenation: Pre-oxygenation with 100% oxygen Induction Type: IV induction Ventilation: Mask ventilation without difficulty Laryngoscope Size: Miller and 2 Grade View: Grade II Tube type: Oral Tube size: 7.0 mm Number of attempts: 1 Airway Equipment and Method: Stylet and Oral airway Placement Confirmation: ETT inserted through vocal cords under direct vision, positive ETCO2 and breath sounds checked- equal and bilateral Secured at: 22 cm Tube secured with: Tape Dental Injury: Teeth and Oropharynx as per pre-operative assessment

## 2024-04-14 NOTE — Transfer of Care (Signed)
 Immediate Anesthesia Transfer of Care Note  Patient: Andrew Barnes  Procedure(s) Performed: ARTHROPLASTY, SHOULDER, TOTAL, REVERSE (Right: Shoulder)  Patient Location: PACU  Anesthesia Type:General  Level of Consciousness: awake, alert , and oriented  Airway & Oxygen Therapy: Patient Spontanous Breathing and Patient connected to face mask oxygen  Post-op Assessment: Report given to RN and Post -op Vital signs reviewed and stable  Post vital signs: Reviewed and stable  Last Vitals:  Vitals Value Taken Time  BP 156/81 04/14/24 09:25  Temp    Pulse 79 04/14/24 09:26  Resp 16 04/14/24 09:26  SpO2 98 % 04/14/24 09:26  Vitals shown include unfiled device data.  Last Pain:  Vitals:   04/14/24 0628  TempSrc: Temporal  PainSc: 4       Patients Stated Pain Goal: 3 (04/14/24 9371)  Complications: No notable events documented.

## 2024-04-14 NOTE — Anesthesia Preprocedure Evaluation (Addendum)
 Anesthesia Evaluation  Patient identified by MRN, date of birth, ID band Patient awake    Reviewed: Allergy & Precautions, NPO status , Patient's Chart, lab work & pertinent test results  Airway Mallampati: II  TM Distance: >3 FB Neck ROM: Full    Dental no notable dental hx.    Pulmonary neg pulmonary ROS   Pulmonary exam normal        Cardiovascular negative cardio ROS Normal cardiovascular exam     Neuro/Psych negative neurological ROS  negative psych ROS   GI/Hepatic negative GI ROS, Neg liver ROS,,,  Endo/Other  Hypothyroidism    Renal/GU negative Renal ROS     Musculoskeletal  (+) Arthritis ,    Abdominal   Peds  Hematology negative hematology ROS (+)   Anesthesia Other Findings right shoulder OA  Reproductive/Obstetrics                             Anesthesia Physical Anesthesia Plan  ASA: 2  Anesthesia Plan: General and Regional   Post-op Pain Management:    Induction: Intravenous  PONV Risk Score and Plan: 2 and Ondansetron , Dexamethasone , Midazolam  and Treatment may vary due to age or medical condition  Airway Management Planned: Oral ETT  Additional Equipment:   Intra-op Plan:   Post-operative Plan: Extubation in OR  Informed Consent: I have reviewed the patients History and Physical, chart, labs and discussed the procedure including the risks, benefits and alternatives for the proposed anesthesia with the patient or authorized representative who has indicated his/her understanding and acceptance.     Dental advisory given  Plan Discussed with: CRNA  Anesthesia Plan Comments:        Anesthesia Quick Evaluation

## 2024-04-14 NOTE — Anesthesia Procedure Notes (Signed)
 Anesthesia Regional Block: Interscalene brachial plexus block   Pre-Anesthetic Checklist: , timeout performed,  Correct Patient, Correct Site, Correct Laterality,  Correct Procedure, Correct Position, site marked,  Risks and benefits discussed,  Surgical consent,  Pre-op evaluation,  At surgeon's request and post-op pain management  Laterality: Right  Prep: chloraprep       Needles:  Injection technique: Single-shot  Needle Type: Echogenic Stimulator Needle     Needle Length: 9cm  Needle Gauge: 21     Additional Needles:   Procedures:,,,, ultrasound used (permanent image in chart),,    Narrative:  Start time: 04/14/2024 7:15 AM End time: 04/14/2024 7:25 AM Injection made incrementally with aspirations every 5 mL.  Performed by: Personally  Anesthesiologist: Patrisha Bernardino SQUIBB, MD  Additional Notes: Functioning IV was confirmed and monitors were applied.  A timeout was performed. Sterile prep, hand hygiene and sterile gloves were used. A 90mm 21ga Arrow echogenic stimulator needle was used. Negative aspiration and negative test dose prior to incremental administration of local anesthetic. The patient tolerated the procedure well.  Ultrasound guidance: relevent anatomy identified, needle position confirmed, local anesthetic spread visualized around nerve(s), vascular puncture avoided.  Image printed for medical record.

## 2024-04-15 ENCOUNTER — Encounter (HOSPITAL_BASED_OUTPATIENT_CLINIC_OR_DEPARTMENT_OTHER): Payer: Self-pay | Admitting: Orthopaedic Surgery

## 2024-04-15 NOTE — Anesthesia Postprocedure Evaluation (Signed)
 Anesthesia Post Note  Patient: Andrew Barnes  Procedure(s) Performed: ARTHROPLASTY, SHOULDER, TOTAL, REVERSE (Right: Shoulder)     Patient location during evaluation: PACU Anesthesia Type: Regional and General Level of consciousness: awake Pain management: pain level controlled Vital Signs Assessment: post-procedure vital signs reviewed and stable Respiratory status: spontaneous breathing, nonlabored ventilation and respiratory function stable Cardiovascular status: blood pressure returned to baseline and stable Postop Assessment: no apparent nausea or vomiting Anesthetic complications: no   No notable events documented.  Last Vitals:  Vitals:   04/14/24 0959 04/14/24 1018  BP:  (!) 147/75  Pulse: 75 73  Resp: 20   Temp:  36.6 C  SpO2: 100% 97%    Last Pain:  Vitals:   04/14/24 1018  TempSrc:   PainSc: 0-No pain                 Andrew Barnes

## 2024-04-26 DIAGNOSIS — M19011 Primary osteoarthritis, right shoulder: Secondary | ICD-10-CM | POA: Diagnosis not present

## 2024-05-11 DIAGNOSIS — M1811 Unilateral primary osteoarthritis of first carpometacarpal joint, right hand: Secondary | ICD-10-CM | POA: Diagnosis not present

## 2024-05-13 DIAGNOSIS — M25611 Stiffness of right shoulder, not elsewhere classified: Secondary | ICD-10-CM | POA: Diagnosis not present

## 2024-05-13 DIAGNOSIS — M19011 Primary osteoarthritis, right shoulder: Secondary | ICD-10-CM | POA: Diagnosis not present

## 2024-05-13 DIAGNOSIS — M6281 Muscle weakness (generalized): Secondary | ICD-10-CM | POA: Diagnosis not present

## 2024-05-13 DIAGNOSIS — S46011D Strain of muscle(s) and tendon(s) of the rotator cuff of right shoulder, subsequent encounter: Secondary | ICD-10-CM | POA: Diagnosis not present

## 2024-05-25 DIAGNOSIS — S46011D Strain of muscle(s) and tendon(s) of the rotator cuff of right shoulder, subsequent encounter: Secondary | ICD-10-CM | POA: Diagnosis not present

## 2024-05-25 DIAGNOSIS — M6281 Muscle weakness (generalized): Secondary | ICD-10-CM | POA: Diagnosis not present

## 2024-05-25 DIAGNOSIS — M19011 Primary osteoarthritis, right shoulder: Secondary | ICD-10-CM | POA: Diagnosis not present

## 2024-05-25 DIAGNOSIS — M25611 Stiffness of right shoulder, not elsewhere classified: Secondary | ICD-10-CM | POA: Diagnosis not present

## 2024-05-27 DIAGNOSIS — M545 Low back pain, unspecified: Secondary | ICD-10-CM | POA: Diagnosis not present

## 2024-05-30 DIAGNOSIS — M25551 Pain in right hip: Secondary | ICD-10-CM | POA: Diagnosis not present

## 2024-06-01 DIAGNOSIS — M25611 Stiffness of right shoulder, not elsewhere classified: Secondary | ICD-10-CM | POA: Diagnosis not present

## 2024-06-01 DIAGNOSIS — M19011 Primary osteoarthritis, right shoulder: Secondary | ICD-10-CM | POA: Diagnosis not present

## 2024-06-01 DIAGNOSIS — M5416 Radiculopathy, lumbar region: Secondary | ICD-10-CM | POA: Diagnosis not present

## 2024-06-01 DIAGNOSIS — S46011D Strain of muscle(s) and tendon(s) of the rotator cuff of right shoulder, subsequent encounter: Secondary | ICD-10-CM | POA: Diagnosis not present

## 2024-06-01 DIAGNOSIS — M6281 Muscle weakness (generalized): Secondary | ICD-10-CM | POA: Diagnosis not present

## 2024-06-06 DIAGNOSIS — M545 Low back pain, unspecified: Secondary | ICD-10-CM | POA: Diagnosis not present

## 2024-06-10 DIAGNOSIS — M19011 Primary osteoarthritis, right shoulder: Secondary | ICD-10-CM | POA: Diagnosis not present

## 2024-06-24 ENCOUNTER — Encounter: Payer: Self-pay | Admitting: Orthopaedic Surgery

## 2024-06-24 ENCOUNTER — Other Ambulatory Visit: Payer: Self-pay | Admitting: Orthopaedic Surgery

## 2024-06-24 ENCOUNTER — Ambulatory Visit
Admission: RE | Admit: 2024-06-24 | Discharge: 2024-06-24 | Disposition: A | Discharge status: Home / Self Care | Source: Ambulatory Visit | Attending: Orthopaedic Surgery | Admitting: Orthopaedic Surgery

## 2024-06-24 DIAGNOSIS — G8929 Other chronic pain: Secondary | ICD-10-CM

## 2024-06-24 DIAGNOSIS — Z96611 Presence of right artificial shoulder joint: Secondary | ICD-10-CM | POA: Diagnosis not present

## 2024-06-24 DIAGNOSIS — M19011 Primary osteoarthritis, right shoulder: Secondary | ICD-10-CM | POA: Diagnosis not present

## 2024-07-05 DIAGNOSIS — M25611 Stiffness of right shoulder, not elsewhere classified: Secondary | ICD-10-CM | POA: Diagnosis not present

## 2024-07-05 DIAGNOSIS — M19011 Primary osteoarthritis, right shoulder: Secondary | ICD-10-CM | POA: Diagnosis not present

## 2024-07-05 DIAGNOSIS — M6281 Muscle weakness (generalized): Secondary | ICD-10-CM | POA: Diagnosis not present

## 2024-07-05 DIAGNOSIS — S46011D Strain of muscle(s) and tendon(s) of the rotator cuff of right shoulder, subsequent encounter: Secondary | ICD-10-CM | POA: Diagnosis not present

## 2024-07-06 DIAGNOSIS — L821 Other seborrheic keratosis: Secondary | ICD-10-CM | POA: Diagnosis not present

## 2024-07-06 DIAGNOSIS — H9313 Tinnitus, bilateral: Secondary | ICD-10-CM | POA: Diagnosis not present

## 2024-07-06 DIAGNOSIS — H90A32 Mixed conductive and sensorineural hearing loss, unilateral, left ear with restricted hearing on the contralateral side: Secondary | ICD-10-CM | POA: Diagnosis not present

## 2024-07-06 DIAGNOSIS — L738 Other specified follicular disorders: Secondary | ICD-10-CM | POA: Diagnosis not present

## 2024-07-06 DIAGNOSIS — D2262 Melanocytic nevi of left upper limb, including shoulder: Secondary | ICD-10-CM | POA: Diagnosis not present

## 2024-07-06 DIAGNOSIS — H6693 Otitis media, unspecified, bilateral: Secondary | ICD-10-CM | POA: Diagnosis not present

## 2024-07-06 DIAGNOSIS — H90A11 Conductive hearing loss, unilateral, right ear with restricted hearing on the contralateral side: Secondary | ICD-10-CM | POA: Diagnosis not present

## 2024-07-06 DIAGNOSIS — D2261 Melanocytic nevi of right upper limb, including shoulder: Secondary | ICD-10-CM | POA: Diagnosis not present

## 2024-07-06 DIAGNOSIS — C4361 Malignant melanoma of right upper limb, including shoulder: Secondary | ICD-10-CM | POA: Diagnosis not present

## 2024-07-06 DIAGNOSIS — D485 Neoplasm of uncertain behavior of skin: Secondary | ICD-10-CM | POA: Diagnosis not present

## 2024-07-27 DIAGNOSIS — C4361 Malignant melanoma of right upper limb, including shoulder: Secondary | ICD-10-CM | POA: Diagnosis not present

## 2024-07-27 DIAGNOSIS — Z85820 Personal history of malignant melanoma of skin: Secondary | ICD-10-CM | POA: Diagnosis not present

## 2024-07-27 DIAGNOSIS — D0361 Melanoma in situ of right upper limb, including shoulder: Secondary | ICD-10-CM | POA: Diagnosis not present

## 2024-08-16 DIAGNOSIS — M25551 Pain in right hip: Secondary | ICD-10-CM | POA: Diagnosis not present

## 2024-08-22 DIAGNOSIS — M8589 Other specified disorders of bone density and structure, multiple sites: Secondary | ICD-10-CM | POA: Diagnosis not present

## 2024-09-12 DIAGNOSIS — M1811 Unilateral primary osteoarthritis of first carpometacarpal joint, right hand: Secondary | ICD-10-CM | POA: Diagnosis not present

## 2024-09-12 DIAGNOSIS — M79644 Pain in right finger(s): Secondary | ICD-10-CM | POA: Diagnosis not present

## 2024-11-11 ENCOUNTER — Ambulatory Visit: Admitting: Pulmonary Disease

## 2024-11-11 ENCOUNTER — Encounter: Payer: Self-pay | Admitting: Pulmonary Disease

## 2024-11-11 VITALS — BP 130/74 | HR 55 | Temp 98.0°F | Ht 66.0 in | Wt 157.6 lb

## 2024-11-11 DIAGNOSIS — R053 Chronic cough: Secondary | ICD-10-CM

## 2024-11-11 DIAGNOSIS — J45991 Cough variant asthma: Secondary | ICD-10-CM | POA: Diagnosis not present

## 2024-11-11 MED ORDER — BREZTRI AEROSPHERE 160-9-4.8 MCG/ACT IN AERO: INHALATION_SPRAY | RESPIRATORY_TRACT | Status: AC

## 2024-11-11 MED ORDER — BREZTRI AEROSPHERE 160-9-4.8 MCG/ACT IN AERO: 2.0000 | INHALATION_SPRAY | Freq: Two times a day (BID) | RESPIRATORY_TRACT | 11 refills | Status: AC

## 2024-11-11 NOTE — Addendum Note (Signed)
 Addended byBETHA FRIES, Shery [place]un[place]a A on: 11/11/2024 10:07 AM   Modules accepted: Orders

## 2024-11-11 NOTE — Patient Instructions (Addendum)
 Nice to see you again  I provided samples of Breztri   I also sent a prescription in  Continue using as you are, few days of a time when the cough comes back.  Can back off once it stops.  If you prefer, you could use Breztri  2 puffs twice a day every day but if you can manage it using it intermittently I am okay with that.  You can call our clinic at [phone].  Leave a message for me and I can make some comments or recommendations about the medicine we discussed for your wife.  I think the neurologist should be well versed  in these type of medications but let me know if I can help, am happy to see her if you like.  Return to clinic in 1 year or sooner if needed with Dr. Annella

## 2024-11-11 NOTE — Progress Notes (Signed)
 "  @Patient  ID: Andrew Barnes, male    DOB: July 26, 1956, 69 y.o.   MRN: [id number]  Chief Complaint  Patient presents with   Cough    Pt states worsening congested chronic cough. Occ wheezing w/ coughing.     Referring provider: Larnell Hamilton, MD  HPI:   69 y.o. man whom we are seeing in follow up for evaluation of chronic cough reliably improved with inhalers.  Most recent orthopedic surgery note reviewed.  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  Improved with prednisone  in the past but improves reliably with inhalers.  Notably Breztri  is been most effective.  He will use it for a few days when cough comes back after a month or 2.  Uses it for few days and gets better.  Has been without this medication for few months with cough is beneficial but worse.  Mostly dry.  Generally productive occasional small amount of mucus.  HPI at initial visit: Patient was usual state of health.  Contracted COVID 06/2022.  Initially improved symptom wise.  Then developed cough starting 09/2022.  Initially productive.  Now mostly dry.  No increased shortness of breath, wheezing etc.  Maybe sometimes worse mornings and evenings.  But present throughout the day.  No other relieving or exacerbating factors.  No seasonal environmental factors he can identify that make things better or worse.  No position make things better or worse.  He reports chest x-ray, I see this via PCP.  I cannot see images.  But reportedly normal.  He was given recent prednisone  course 20 mg daily.  No significant improvement per his report.  Not using maintenance inhalers.  Past medical history: Hypothyroid Surgical history: Lithotripsy, knee surgery, wrist surgery Family history:History reviewed. No pertinent family history. Social history: Never smoker, lives in [place] / Pulmonary Flowsheets:   ACT:      No data to display          MMRC:     No data to display           Epworth:      No data to display          Tests:   FENO:  No results found for: NITRICOXIDE  PFT:     No data to display          WALK:      No data to display          Imaging: Personally reviewed and as per EMR No results found.  Lab Results: Personally reviewed and as per EMR CBC    Component Value Date/Time   WBC 6.0 04/03/2024 2238   RBC 4.37 04/03/2024 2238   HGB 13.3 04/03/2024 2242   HCT 39.0 04/03/2024 2242   PLT 278 04/03/2024 2238   MCV 94.3 04/03/2024 2238   MCH 32.0 04/03/2024 2238   MCHC 34.0 04/03/2024 2238   RDW 13.1 04/03/2024 2238   LYMPHSABS 1.6 04/03/2024 2238   MONOABS 0.4 04/03/2024 2238   EOSABS 0.0 04/03/2024 2238   BASOSABS 0.0 04/03/2024 2238    BMET    Component Value Date/Time   NA 146 (H) 04/03/2024 2242   K 4.0 04/03/2024 2242   CL 112 (H) 04/03/2024 2238   CO2 22 04/03/2024 2238   GLUCOSE 116 (H) 04/03/2024 2238   BUN 21 04/03/2024 2238   CREATININE 1.06 04/03/2024 2238   CALCIUM 9.1 04/03/2024 2238   GFRNONAA >60 04/03/2024 2238   GFRAA >60  08/17/2018 0529    BNP No results found for: BNP  ProBNP No results found for: PROBNP  Specialty Problems   None   Allergies  Allergen Reactions   Azithromycin Nausea And Vomiting and Nausea Only   Erythromycin Nausea And Vomiting   Sulfa Antibiotics     Red splotches     There is no immunization history on file for this patient.  Past Medical History:  Diagnosis Date   Arthritis    oa   History of kidney stones    Hyperlipidemia     Tobacco History: Social History   Tobacco Use  Smoking Status Never   Passive exposure: Past  Smokeless Tobacco Never   Counseling given: Not Answered   Continue to not smoke  Outpatient Encounter Medications as of 11/11/2024  Medication Sig   aspirin  EC 81 MG tablet Take 81 mg by mouth once.   celecoxib (CELEBREX) 100 MG capsule Take 100 mg by mouth 2 (two) times daily.   levothyroxine   (SYNTHROID , LEVOTHROID) 50 MCG tablet Take 50 mcg by mouth daily.   Multiple Vitamin (MULTIVITAMIN WITH MINERALS) TABS tablet Take 1 tablet by mouth every evening.   pravastatin  (PRAVACHOL ) 40 MG tablet Take 40 mg by mouth at bedtime.    traZODone (DESYREL) 100 MG tablet Take 100 mg by mouth at bedtime as needed.   [DISCONTINUED] Budeson-Glycopyrrol-Formoterol (BREZTRI  AEROSPHERE) 160-9-4.8 MCG/ACT AERO Inhale 2 puffs into the lungs in the morning and at bedtime.   [DISCONTINUED] Budeson-Glycopyrrol-Formoterol (BREZTRI  AEROSPHERE) 160-9-4.8 MCG/ACT AERO Inhale 2 puffs into the lungs in the morning and at bedtime.   budesonide-glycopyrrolate-formoterol (BREZTRI  AEROSPHERE) 160-9-4.8 MCG/ACT AERO inhaler Inhale 2 puffs into the lungs in the morning and at bedtime.   [DISCONTINUED] budeson-glycopyrrolate-formoterol (BREZTRI  AEROSPHERE) 160-9-4.8 MCG/ACT AERO inhaler Inhale 2 puffs into the lungs 2 (two) times daily. (Patient not taking: Reported on 11/11/2024)   No facility-administered encounter medications on file as of 11/11/2024.     Review of Systems  Review of Systems  N/a Physical Exam  BP 130/74   Pulse (!) 55   Temp 98 F (36.7 C)   Ht 5' 6 (1.676 m)   Wt 157 lb 9.6 oz (71.5 kg)   SpO2 99%   BMI 25.44 kg/m   Wt Readings from Last 5 Encounters:  11/11/24 157 lb 9.6 oz (71.5 kg)  04/14/24 161 lb 6 oz (73.2 kg)  04/03/24 179 lb 14.3 oz (81.6 kg)  02/15/24 179 lb 12.8 oz (81.6 kg)  04/16/23 157 lb (71.2 kg)    BMI Readings from Last 5 Encounters:  11/11/24 25.44 kg/m  04/14/24 26.05 kg/m  04/03/24 29.04 kg/m  02/15/24 29.02 kg/m  04/16/23 24.59 kg/m     Physical Exam General: In the exam chair, no distress Eyes: No icterus Neck: No JVP Abdomen: Nondistended Pulmonary: Clear, good air excursion, normal work of breathing Cardiovascular: Warm, no edema  Assessment & Plan:   Chronic cough: After COVID infection in 2023.  Likely postviral triggered.  Mostly  dry.  Improved with asthma therapies as below.  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  Continue Breztri .  Refilled today.   Return in about 1 year (around 11/11/2025) for f/u Dr. Annella.   Donnice JONELLE Annella, MD 11/11/2024    "
